# Patient Record
Sex: Female | Born: 1935 | Race: White | Hispanic: No | Smoking: Never smoker
Health system: Southern US, Community
[De-identification: ages and names within clinical notes are randomized; demographics above are authoritative.]

---

## 2011-01-25 DIAGNOSIS — Z950 Presence of cardiac pacemaker: Secondary | ICD-10-CM | POA: Diagnosis not present

## 2011-02-20 DIAGNOSIS — I4891 Unspecified atrial fibrillation: Secondary | ICD-10-CM | POA: Diagnosis not present

## 2011-02-20 DIAGNOSIS — R0602 Shortness of breath: Secondary | ICD-10-CM | POA: Diagnosis not present

## 2011-02-20 DIAGNOSIS — Z79899 Other long term (current) drug therapy: Secondary | ICD-10-CM | POA: Diagnosis not present

## 2011-02-20 DIAGNOSIS — Z7901 Long term (current) use of anticoagulants: Secondary | ICD-10-CM | POA: Diagnosis not present

## 2011-02-20 DIAGNOSIS — I10 Essential (primary) hypertension: Secondary | ICD-10-CM | POA: Diagnosis not present

## 2011-02-20 DIAGNOSIS — E785 Hyperlipidemia, unspecified: Secondary | ICD-10-CM | POA: Diagnosis not present

## 2011-02-20 DIAGNOSIS — Z950 Presence of cardiac pacemaker: Secondary | ICD-10-CM | POA: Diagnosis not present

## 2011-03-06 DIAGNOSIS — I4891 Unspecified atrial fibrillation: Secondary | ICD-10-CM | POA: Diagnosis not present

## 2011-03-06 DIAGNOSIS — M79609 Pain in unspecified limb: Secondary | ICD-10-CM | POA: Diagnosis not present

## 2011-03-06 DIAGNOSIS — I119 Hypertensive heart disease without heart failure: Secondary | ICD-10-CM | POA: Diagnosis not present

## 2011-05-07 DIAGNOSIS — I4891 Unspecified atrial fibrillation: Secondary | ICD-10-CM | POA: Diagnosis not present

## 2011-05-07 DIAGNOSIS — I10 Essential (primary) hypertension: Secondary | ICD-10-CM | POA: Diagnosis not present

## 2011-05-07 DIAGNOSIS — E785 Hyperlipidemia, unspecified: Secondary | ICD-10-CM | POA: Diagnosis not present

## 2011-05-07 DIAGNOSIS — R0602 Shortness of breath: Secondary | ICD-10-CM | POA: Diagnosis not present

## 2011-06-15 DIAGNOSIS — E7800 Pure hypercholesterolemia, unspecified: Secondary | ICD-10-CM | POA: Diagnosis not present

## 2011-06-15 DIAGNOSIS — Z950 Presence of cardiac pacemaker: Secondary | ICD-10-CM | POA: Diagnosis not present

## 2011-06-15 DIAGNOSIS — R404 Transient alteration of awareness: Secondary | ICD-10-CM | POA: Diagnosis not present

## 2011-06-15 DIAGNOSIS — R5383 Other fatigue: Secondary | ICD-10-CM | POA: Diagnosis not present

## 2011-06-15 DIAGNOSIS — I10 Essential (primary) hypertension: Secondary | ICD-10-CM | POA: Diagnosis not present

## 2011-06-15 DIAGNOSIS — J449 Chronic obstructive pulmonary disease, unspecified: Secondary | ICD-10-CM | POA: Diagnosis not present

## 2011-06-15 DIAGNOSIS — R5381 Other malaise: Secondary | ICD-10-CM | POA: Diagnosis not present

## 2011-06-15 DIAGNOSIS — R0602 Shortness of breath: Secondary | ICD-10-CM | POA: Diagnosis not present

## 2011-06-15 DIAGNOSIS — R42 Dizziness and giddiness: Secondary | ICD-10-CM | POA: Diagnosis not present

## 2011-06-15 DIAGNOSIS — I4891 Unspecified atrial fibrillation: Secondary | ICD-10-CM | POA: Diagnosis not present

## 2011-06-18 DIAGNOSIS — R5383 Other fatigue: Secondary | ICD-10-CM | POA: Diagnosis not present

## 2011-06-18 DIAGNOSIS — E7800 Pure hypercholesterolemia, unspecified: Secondary | ICD-10-CM | POA: Diagnosis not present

## 2011-06-18 DIAGNOSIS — R5381 Other malaise: Secondary | ICD-10-CM | POA: Diagnosis not present

## 2011-06-18 DIAGNOSIS — E559 Vitamin D deficiency, unspecified: Secondary | ICD-10-CM | POA: Diagnosis not present

## 2011-06-18 DIAGNOSIS — G479 Sleep disorder, unspecified: Secondary | ICD-10-CM | POA: Diagnosis not present

## 2011-09-06 DIAGNOSIS — Z950 Presence of cardiac pacemaker: Secondary | ICD-10-CM | POA: Diagnosis not present

## 2011-10-03 DIAGNOSIS — Z7901 Long term (current) use of anticoagulants: Secondary | ICD-10-CM | POA: Diagnosis not present

## 2011-10-03 DIAGNOSIS — Z79899 Other long term (current) drug therapy: Secondary | ICD-10-CM | POA: Diagnosis not present

## 2011-10-03 DIAGNOSIS — I4891 Unspecified atrial fibrillation: Secondary | ICD-10-CM | POA: Diagnosis not present

## 2011-10-03 DIAGNOSIS — Z87891 Personal history of nicotine dependence: Secondary | ICD-10-CM | POA: Diagnosis not present

## 2011-10-19 DIAGNOSIS — I10 Essential (primary) hypertension: Secondary | ICD-10-CM | POA: Diagnosis not present

## 2011-10-19 DIAGNOSIS — I4891 Unspecified atrial fibrillation: Secondary | ICD-10-CM | POA: Diagnosis not present

## 2011-10-25 DIAGNOSIS — Z23 Encounter for immunization: Secondary | ICD-10-CM | POA: Diagnosis not present

## 2011-12-03 DIAGNOSIS — R1310 Dysphagia, unspecified: Secondary | ICD-10-CM | POA: Diagnosis not present

## 2011-12-03 DIAGNOSIS — M4800 Spinal stenosis, site unspecified: Secondary | ICD-10-CM | POA: Diagnosis not present

## 2011-12-19 DIAGNOSIS — I499 Cardiac arrhythmia, unspecified: Secondary | ICD-10-CM | POA: Diagnosis not present

## 2012-01-21 DIAGNOSIS — R1310 Dysphagia, unspecified: Secondary | ICD-10-CM | POA: Diagnosis not present

## 2012-01-21 DIAGNOSIS — M4800 Spinal stenosis, site unspecified: Secondary | ICD-10-CM | POA: Diagnosis not present

## 2012-01-21 DIAGNOSIS — M1610 Unilateral primary osteoarthritis, unspecified hip: Secondary | ICD-10-CM | POA: Diagnosis not present

## 2012-01-21 DIAGNOSIS — Z79899 Other long term (current) drug therapy: Secondary | ICD-10-CM | POA: Diagnosis not present

## 2012-04-03 DIAGNOSIS — Z950 Presence of cardiac pacemaker: Secondary | ICD-10-CM | POA: Diagnosis not present

## 2012-04-03 DIAGNOSIS — E7800 Pure hypercholesterolemia, unspecified: Secondary | ICD-10-CM | POA: Diagnosis not present

## 2012-04-03 DIAGNOSIS — Y838 Other surgical procedures as the cause of abnormal reaction of the patient, or of later complication, without mention of misadventure at the time of the procedure: Secondary | ICD-10-CM | POA: Diagnosis not present

## 2012-04-03 DIAGNOSIS — Z8673 Personal history of transient ischemic attack (TIA), and cerebral infarction without residual deficits: Secondary | ICD-10-CM | POA: Diagnosis not present

## 2012-04-03 DIAGNOSIS — IMO0002 Reserved for concepts with insufficient information to code with codable children: Secondary | ICD-10-CM | POA: Diagnosis not present

## 2012-04-03 DIAGNOSIS — I4891 Unspecified atrial fibrillation: Secondary | ICD-10-CM | POA: Diagnosis not present

## 2012-04-03 DIAGNOSIS — Z7901 Long term (current) use of anticoagulants: Secondary | ICD-10-CM | POA: Diagnosis not present

## 2012-04-03 DIAGNOSIS — J449 Chronic obstructive pulmonary disease, unspecified: Secondary | ICD-10-CM | POA: Diagnosis not present

## 2012-04-03 DIAGNOSIS — I10 Essential (primary) hypertension: Secondary | ICD-10-CM | POA: Diagnosis not present

## 2012-04-07 DIAGNOSIS — I498 Other specified cardiac arrhythmias: Secondary | ICD-10-CM | POA: Diagnosis not present

## 2012-04-21 DIAGNOSIS — Z79899 Other long term (current) drug therapy: Secondary | ICD-10-CM | POA: Diagnosis not present

## 2012-05-13 DIAGNOSIS — E785 Hyperlipidemia, unspecified: Secondary | ICD-10-CM | POA: Diagnosis not present

## 2012-05-13 DIAGNOSIS — I10 Essential (primary) hypertension: Secondary | ICD-10-CM | POA: Diagnosis not present

## 2012-05-13 DIAGNOSIS — Z950 Presence of cardiac pacemaker: Secondary | ICD-10-CM | POA: Diagnosis not present

## 2012-05-13 DIAGNOSIS — I4891 Unspecified atrial fibrillation: Secondary | ICD-10-CM | POA: Diagnosis not present

## 2012-05-13 DIAGNOSIS — Z87891 Personal history of nicotine dependence: Secondary | ICD-10-CM | POA: Diagnosis not present

## 2012-07-30 DIAGNOSIS — I498 Other specified cardiac arrhythmias: Secondary | ICD-10-CM | POA: Diagnosis not present

## 2012-10-24 DIAGNOSIS — Z006 Encounter for examination for normal comparison and control in clinical research program: Secondary | ICD-10-CM | POA: Diagnosis not present

## 2012-10-24 DIAGNOSIS — E7800 Pure hypercholesterolemia, unspecified: Secondary | ICD-10-CM | POA: Diagnosis not present

## 2012-10-24 DIAGNOSIS — E559 Vitamin D deficiency, unspecified: Secondary | ICD-10-CM | POA: Diagnosis not present

## 2012-10-24 DIAGNOSIS — I10 Essential (primary) hypertension: Secondary | ICD-10-CM | POA: Diagnosis not present

## 2012-10-24 DIAGNOSIS — Z23 Encounter for immunization: Secondary | ICD-10-CM | POA: Diagnosis not present

## 2012-10-24 DIAGNOSIS — Z79899 Other long term (current) drug therapy: Secondary | ICD-10-CM | POA: Diagnosis not present

## 2012-10-24 DIAGNOSIS — K219 Gastro-esophageal reflux disease without esophagitis: Secondary | ICD-10-CM | POA: Diagnosis not present

## 2012-11-11 DIAGNOSIS — M810 Age-related osteoporosis without current pathological fracture: Secondary | ICD-10-CM | POA: Diagnosis not present

## 2012-11-13 DIAGNOSIS — Z950 Presence of cardiac pacemaker: Secondary | ICD-10-CM | POA: Diagnosis not present

## 2012-11-13 DIAGNOSIS — I4891 Unspecified atrial fibrillation: Secondary | ICD-10-CM | POA: Diagnosis not present

## 2012-11-13 DIAGNOSIS — I10 Essential (primary) hypertension: Secondary | ICD-10-CM | POA: Diagnosis not present

## 2012-11-13 DIAGNOSIS — E785 Hyperlipidemia, unspecified: Secondary | ICD-10-CM | POA: Diagnosis not present

## 2012-12-25 DIAGNOSIS — Z950 Presence of cardiac pacemaker: Secondary | ICD-10-CM | POA: Diagnosis not present

## 2012-12-25 DIAGNOSIS — I4891 Unspecified atrial fibrillation: Secondary | ICD-10-CM | POA: Diagnosis not present

## 2013-01-11 DIAGNOSIS — J189 Pneumonia, unspecified organism: Secondary | ICD-10-CM | POA: Diagnosis not present

## 2013-01-11 DIAGNOSIS — K219 Gastro-esophageal reflux disease without esophagitis: Secondary | ICD-10-CM | POA: Diagnosis present

## 2013-01-11 DIAGNOSIS — Z7901 Long term (current) use of anticoagulants: Secondary | ICD-10-CM | POA: Diagnosis not present

## 2013-01-11 DIAGNOSIS — I4891 Unspecified atrial fibrillation: Secondary | ICD-10-CM | POA: Diagnosis not present

## 2013-01-11 DIAGNOSIS — M1990 Unspecified osteoarthritis, unspecified site: Secondary | ICD-10-CM | POA: Diagnosis present

## 2013-01-11 DIAGNOSIS — J9600 Acute respiratory failure, unspecified whether with hypoxia or hypercapnia: Secondary | ICD-10-CM | POA: Diagnosis not present

## 2013-01-11 DIAGNOSIS — R0989 Other specified symptoms and signs involving the circulatory and respiratory systems: Secondary | ICD-10-CM | POA: Diagnosis not present

## 2013-01-11 DIAGNOSIS — E785 Hyperlipidemia, unspecified: Secondary | ICD-10-CM | POA: Diagnosis not present

## 2013-01-11 DIAGNOSIS — R0602 Shortness of breath: Secondary | ICD-10-CM | POA: Diagnosis not present

## 2013-01-11 DIAGNOSIS — F329 Major depressive disorder, single episode, unspecified: Secondary | ICD-10-CM | POA: Diagnosis present

## 2013-01-11 DIAGNOSIS — R5381 Other malaise: Secondary | ICD-10-CM | POA: Diagnosis not present

## 2013-01-11 DIAGNOSIS — J441 Chronic obstructive pulmonary disease with (acute) exacerbation: Secondary | ICD-10-CM | POA: Diagnosis present

## 2013-01-11 DIAGNOSIS — Z950 Presence of cardiac pacemaker: Secondary | ICD-10-CM | POA: Diagnosis not present

## 2013-01-11 DIAGNOSIS — I129 Hypertensive chronic kidney disease with stage 1 through stage 4 chronic kidney disease, or unspecified chronic kidney disease: Secondary | ICD-10-CM | POA: Diagnosis not present

## 2013-01-11 DIAGNOSIS — F411 Generalized anxiety disorder: Secondary | ICD-10-CM | POA: Diagnosis present

## 2013-01-11 DIAGNOSIS — Z8673 Personal history of transient ischemic attack (TIA), and cerebral infarction without residual deficits: Secondary | ICD-10-CM | POA: Diagnosis not present

## 2013-01-11 DIAGNOSIS — Z79899 Other long term (current) drug therapy: Secondary | ICD-10-CM | POA: Diagnosis not present

## 2013-01-19 DIAGNOSIS — J159 Unspecified bacterial pneumonia: Secondary | ICD-10-CM | POA: Diagnosis not present

## 2013-01-19 DIAGNOSIS — I4891 Unspecified atrial fibrillation: Secondary | ICD-10-CM | POA: Diagnosis not present

## 2013-01-19 DIAGNOSIS — R0902 Hypoxemia: Secondary | ICD-10-CM | POA: Diagnosis not present

## 2013-01-19 DIAGNOSIS — I10 Essential (primary) hypertension: Secondary | ICD-10-CM | POA: Diagnosis not present

## 2013-03-18 DIAGNOSIS — L0291 Cutaneous abscess, unspecified: Secondary | ICD-10-CM | POA: Diagnosis not present

## 2013-03-18 DIAGNOSIS — L0390 Cellulitis, unspecified: Secondary | ICD-10-CM | POA: Diagnosis not present

## 2013-03-20 DIAGNOSIS — D72829 Elevated white blood cell count, unspecified: Secondary | ICD-10-CM | POA: Diagnosis not present

## 2013-03-20 DIAGNOSIS — M79609 Pain in unspecified limb: Secondary | ICD-10-CM | POA: Diagnosis not present

## 2013-03-20 DIAGNOSIS — L0390 Cellulitis, unspecified: Secondary | ICD-10-CM | POA: Diagnosis not present

## 2013-03-20 DIAGNOSIS — L0291 Cutaneous abscess, unspecified: Secondary | ICD-10-CM | POA: Diagnosis not present

## 2013-03-20 DIAGNOSIS — D649 Anemia, unspecified: Secondary | ICD-10-CM | POA: Diagnosis not present

## 2013-03-20 DIAGNOSIS — D539 Nutritional anemia, unspecified: Secondary | ICD-10-CM | POA: Diagnosis not present

## 2013-03-23 DIAGNOSIS — D649 Anemia, unspecified: Secondary | ICD-10-CM | POA: Diagnosis not present

## 2013-03-23 DIAGNOSIS — M25529 Pain in unspecified elbow: Secondary | ICD-10-CM | POA: Diagnosis not present

## 2013-03-23 DIAGNOSIS — L0390 Cellulitis, unspecified: Secondary | ICD-10-CM | POA: Diagnosis not present

## 2013-03-23 DIAGNOSIS — M79609 Pain in unspecified limb: Secondary | ICD-10-CM | POA: Diagnosis not present

## 2013-03-23 DIAGNOSIS — L0291 Cutaneous abscess, unspecified: Secondary | ICD-10-CM | POA: Diagnosis not present

## 2013-03-27 DIAGNOSIS — D649 Anemia, unspecified: Secondary | ICD-10-CM | POA: Diagnosis not present

## 2013-03-30 DIAGNOSIS — I498 Other specified cardiac arrhythmias: Secondary | ICD-10-CM | POA: Diagnosis not present

## 2013-04-06 DIAGNOSIS — E538 Deficiency of other specified B group vitamins: Secondary | ICD-10-CM | POA: Diagnosis not present

## 2013-04-14 DIAGNOSIS — R195 Other fecal abnormalities: Secondary | ICD-10-CM | POA: Diagnosis not present

## 2013-04-14 DIAGNOSIS — D649 Anemia, unspecified: Secondary | ICD-10-CM | POA: Diagnosis not present

## 2013-04-15 DIAGNOSIS — N183 Chronic kidney disease, stage 3 unspecified: Secondary | ICD-10-CM | POA: Diagnosis not present

## 2013-04-16 ENCOUNTER — Other Ambulatory Visit: Payer: Self-pay | Admitting: Unknown Physician Specialty

## 2013-04-16 DIAGNOSIS — D649 Anemia, unspecified: Secondary | ICD-10-CM

## 2013-04-16 DIAGNOSIS — R195 Other fecal abnormalities: Secondary | ICD-10-CM

## 2013-05-05 ENCOUNTER — Ambulatory Visit
Admission: RE | Admit: 2013-05-05 | Discharge: 2013-05-05 | Disposition: A | Discharge status: Home / Self Care | Payer: Medicare Other | Source: Ambulatory Visit | Attending: Unknown Physician Specialty | Admitting: Unknown Physician Specialty

## 2013-05-05 DIAGNOSIS — K5730 Diverticulosis of large intestine without perforation or abscess without bleeding: Secondary | ICD-10-CM | POA: Diagnosis not present

## 2013-05-05 DIAGNOSIS — D649 Anemia, unspecified: Secondary | ICD-10-CM

## 2013-05-05 DIAGNOSIS — R195 Other fecal abnormalities: Secondary | ICD-10-CM

## 2013-05-07 DIAGNOSIS — R195 Other fecal abnormalities: Secondary | ICD-10-CM | POA: Diagnosis not present

## 2013-05-12 DIAGNOSIS — R195 Other fecal abnormalities: Secondary | ICD-10-CM | POA: Diagnosis not present

## 2013-05-18 DIAGNOSIS — D649 Anemia, unspecified: Secondary | ICD-10-CM | POA: Diagnosis not present

## 2013-05-22 DIAGNOSIS — I4891 Unspecified atrial fibrillation: Secondary | ICD-10-CM | POA: Diagnosis not present

## 2013-05-22 DIAGNOSIS — Z79899 Other long term (current) drug therapy: Secondary | ICD-10-CM | POA: Diagnosis not present

## 2013-05-22 DIAGNOSIS — E785 Hyperlipidemia, unspecified: Secondary | ICD-10-CM | POA: Diagnosis not present

## 2013-05-22 DIAGNOSIS — I10 Essential (primary) hypertension: Secondary | ICD-10-CM | POA: Diagnosis not present

## 2013-05-22 DIAGNOSIS — Z7901 Long term (current) use of anticoagulants: Secondary | ICD-10-CM | POA: Diagnosis not present

## 2013-05-22 DIAGNOSIS — Z950 Presence of cardiac pacemaker: Secondary | ICD-10-CM | POA: Diagnosis not present

## 2013-05-26 DIAGNOSIS — E538 Deficiency of other specified B group vitamins: Secondary | ICD-10-CM | POA: Diagnosis not present

## 2013-06-02 DIAGNOSIS — D500 Iron deficiency anemia secondary to blood loss (chronic): Secondary | ICD-10-CM | POA: Diagnosis not present

## 2013-06-02 DIAGNOSIS — E538 Deficiency of other specified B group vitamins: Secondary | ICD-10-CM | POA: Diagnosis not present

## 2013-06-09 DIAGNOSIS — E538 Deficiency of other specified B group vitamins: Secondary | ICD-10-CM | POA: Diagnosis not present

## 2013-06-20 DIAGNOSIS — B029 Zoster without complications: Secondary | ICD-10-CM | POA: Diagnosis not present

## 2013-06-20 DIAGNOSIS — B354 Tinea corporis: Secondary | ICD-10-CM | POA: Diagnosis not present

## 2013-06-25 DIAGNOSIS — Z950 Presence of cardiac pacemaker: Secondary | ICD-10-CM | POA: Diagnosis not present

## 2013-08-11 DIAGNOSIS — K921 Melena: Secondary | ICD-10-CM | POA: Diagnosis not present

## 2013-08-14 DIAGNOSIS — R195 Other fecal abnormalities: Secondary | ICD-10-CM | POA: Diagnosis not present

## 2013-08-14 DIAGNOSIS — E538 Deficiency of other specified B group vitamins: Secondary | ICD-10-CM | POA: Diagnosis not present

## 2013-09-28 DIAGNOSIS — I498 Other specified cardiac arrhythmias: Secondary | ICD-10-CM | POA: Diagnosis not present

## 2013-10-01 DIAGNOSIS — D62 Acute posthemorrhagic anemia: Secondary | ICD-10-CM | POA: Diagnosis not present

## 2013-10-01 DIAGNOSIS — K5730 Diverticulosis of large intestine without perforation or abscess without bleeding: Secondary | ICD-10-CM | POA: Diagnosis not present

## 2013-10-01 DIAGNOSIS — K921 Melena: Secondary | ICD-10-CM | POA: Diagnosis not present

## 2013-10-02 DIAGNOSIS — J438 Other emphysema: Secondary | ICD-10-CM | POA: Diagnosis not present

## 2013-10-02 DIAGNOSIS — Z950 Presence of cardiac pacemaker: Secondary | ICD-10-CM | POA: Diagnosis not present

## 2013-10-02 DIAGNOSIS — N390 Urinary tract infection, site not specified: Secondary | ICD-10-CM | POA: Diagnosis not present

## 2013-10-02 DIAGNOSIS — N183 Chronic kidney disease, stage 3 unspecified: Secondary | ICD-10-CM | POA: Diagnosis present

## 2013-10-02 DIAGNOSIS — R5383 Other fatigue: Secondary | ICD-10-CM | POA: Diagnosis not present

## 2013-10-02 DIAGNOSIS — M1990 Unspecified osteoarthritis, unspecified site: Secondary | ICD-10-CM | POA: Diagnosis present

## 2013-10-02 DIAGNOSIS — E785 Hyperlipidemia, unspecified: Secondary | ICD-10-CM | POA: Diagnosis present

## 2013-10-02 DIAGNOSIS — K625 Hemorrhage of anus and rectum: Secondary | ICD-10-CM | POA: Diagnosis not present

## 2013-10-02 DIAGNOSIS — I129 Hypertensive chronic kidney disease with stage 1 through stage 4 chronic kidney disease, or unspecified chronic kidney disease: Secondary | ICD-10-CM | POA: Diagnosis present

## 2013-10-02 DIAGNOSIS — R5381 Other malaise: Secondary | ICD-10-CM | POA: Diagnosis not present

## 2013-10-02 DIAGNOSIS — K921 Melena: Secondary | ICD-10-CM | POA: Diagnosis not present

## 2013-10-02 DIAGNOSIS — K5731 Diverticulosis of large intestine without perforation or abscess with bleeding: Secondary | ICD-10-CM | POA: Diagnosis present

## 2013-10-02 DIAGNOSIS — K922 Gastrointestinal hemorrhage, unspecified: Secondary | ICD-10-CM | POA: Diagnosis not present

## 2013-10-02 DIAGNOSIS — Z23 Encounter for immunization: Secondary | ICD-10-CM | POA: Diagnosis not present

## 2013-10-02 DIAGNOSIS — D62 Acute posthemorrhagic anemia: Secondary | ICD-10-CM | POA: Diagnosis not present

## 2013-10-02 DIAGNOSIS — Z87891 Personal history of nicotine dependence: Secondary | ICD-10-CM | POA: Diagnosis not present

## 2013-10-02 DIAGNOSIS — K219 Gastro-esophageal reflux disease without esophagitis: Secondary | ICD-10-CM | POA: Diagnosis present

## 2013-10-02 DIAGNOSIS — I4891 Unspecified atrial fibrillation: Secondary | ICD-10-CM | POA: Diagnosis not present

## 2013-10-02 DIAGNOSIS — F329 Major depressive disorder, single episode, unspecified: Secondary | ICD-10-CM | POA: Diagnosis present

## 2013-10-02 DIAGNOSIS — K5730 Diverticulosis of large intestine without perforation or abscess without bleeding: Secondary | ICD-10-CM | POA: Diagnosis not present

## 2013-10-02 DIAGNOSIS — B961 Klebsiella pneumoniae [K. pneumoniae] as the cause of diseases classified elsewhere: Secondary | ICD-10-CM | POA: Diagnosis present

## 2013-10-02 DIAGNOSIS — K449 Diaphragmatic hernia without obstruction or gangrene: Secondary | ICD-10-CM | POA: Diagnosis not present

## 2013-10-02 DIAGNOSIS — Z7901 Long term (current) use of anticoagulants: Secondary | ICD-10-CM | POA: Diagnosis not present

## 2013-10-02 DIAGNOSIS — F411 Generalized anxiety disorder: Secondary | ICD-10-CM | POA: Diagnosis present

## 2013-10-02 DIAGNOSIS — N3000 Acute cystitis without hematuria: Secondary | ICD-10-CM | POA: Diagnosis present

## 2013-10-02 DIAGNOSIS — F3289 Other specified depressive episodes: Secondary | ICD-10-CM | POA: Diagnosis present

## 2013-10-02 DIAGNOSIS — Z8673 Personal history of transient ischemic attack (TIA), and cerebral infarction without residual deficits: Secondary | ICD-10-CM | POA: Diagnosis not present

## 2013-10-02 DIAGNOSIS — J449 Chronic obstructive pulmonary disease, unspecified: Secondary | ICD-10-CM | POA: Diagnosis present

## 2013-10-12 DIAGNOSIS — N390 Urinary tract infection, site not specified: Secondary | ICD-10-CM | POA: Diagnosis not present

## 2013-10-12 DIAGNOSIS — K922 Gastrointestinal hemorrhage, unspecified: Secondary | ICD-10-CM | POA: Diagnosis not present

## 2013-10-12 DIAGNOSIS — D510 Vitamin B12 deficiency anemia due to intrinsic factor deficiency: Secondary | ICD-10-CM | POA: Diagnosis not present

## 2013-10-12 DIAGNOSIS — I4891 Unspecified atrial fibrillation: Secondary | ICD-10-CM | POA: Diagnosis not present

## 2013-10-12 DIAGNOSIS — M4800 Spinal stenosis, site unspecified: Secondary | ICD-10-CM | POA: Diagnosis not present

## 2013-10-12 DIAGNOSIS — I10 Essential (primary) hypertension: Secondary | ICD-10-CM | POA: Diagnosis not present

## 2013-10-12 DIAGNOSIS — Z23 Encounter for immunization: Secondary | ICD-10-CM | POA: Diagnosis not present

## 2013-10-16 DIAGNOSIS — Z7901 Long term (current) use of anticoagulants: Secondary | ICD-10-CM | POA: Diagnosis not present

## 2013-10-16 DIAGNOSIS — E785 Hyperlipidemia, unspecified: Secondary | ICD-10-CM | POA: Diagnosis not present

## 2013-10-16 DIAGNOSIS — K5791 Diverticulosis of intestine, part unspecified, without perforation or abscess with bleeding: Secondary | ICD-10-CM | POA: Diagnosis not present

## 2013-10-16 DIAGNOSIS — D500 Iron deficiency anemia secondary to blood loss (chronic): Secondary | ICD-10-CM | POA: Diagnosis not present

## 2013-10-16 DIAGNOSIS — I4891 Unspecified atrial fibrillation: Secondary | ICD-10-CM | POA: Diagnosis not present

## 2013-10-16 DIAGNOSIS — Z950 Presence of cardiac pacemaker: Secondary | ICD-10-CM | POA: Diagnosis not present

## 2013-10-21 DIAGNOSIS — K5791 Diverticulosis of intestine, part unspecified, without perforation or abscess with bleeding: Secondary | ICD-10-CM | POA: Diagnosis not present

## 2013-11-04 DIAGNOSIS — D500 Iron deficiency anemia secondary to blood loss (chronic): Secondary | ICD-10-CM | POA: Diagnosis not present

## 2013-11-04 DIAGNOSIS — Z7901 Long term (current) use of anticoagulants: Secondary | ICD-10-CM | POA: Diagnosis not present

## 2013-11-04 DIAGNOSIS — I4891 Unspecified atrial fibrillation: Secondary | ICD-10-CM | POA: Diagnosis not present

## 2013-11-18 DIAGNOSIS — K5791 Diverticulosis of intestine, part unspecified, without perforation or abscess with bleeding: Secondary | ICD-10-CM | POA: Diagnosis not present

## 2013-11-18 DIAGNOSIS — D510 Vitamin B12 deficiency anemia due to intrinsic factor deficiency: Secondary | ICD-10-CM | POA: Diagnosis not present

## 2014-04-08 DIAGNOSIS — Z45010 Encounter for checking and testing of cardiac pacemaker pulse generator [battery]: Secondary | ICD-10-CM | POA: Diagnosis not present

## 2014-04-08 DIAGNOSIS — R001 Bradycardia, unspecified: Secondary | ICD-10-CM | POA: Diagnosis not present

## 2014-07-27 DIAGNOSIS — I4891 Unspecified atrial fibrillation: Secondary | ICD-10-CM | POA: Diagnosis not present

## 2014-07-27 DIAGNOSIS — N183 Chronic kidney disease, stage 3 (moderate): Secondary | ICD-10-CM | POA: Diagnosis not present

## 2014-07-27 DIAGNOSIS — E780 Pure hypercholesterolemia: Secondary | ICD-10-CM | POA: Diagnosis not present

## 2014-07-27 DIAGNOSIS — I10 Essential (primary) hypertension: Secondary | ICD-10-CM | POA: Diagnosis not present

## 2014-07-27 DIAGNOSIS — N3000 Acute cystitis without hematuria: Secondary | ICD-10-CM | POA: Diagnosis not present

## 2014-07-27 DIAGNOSIS — Z79899 Other long term (current) drug therapy: Secondary | ICD-10-CM | POA: Diagnosis not present

## 2014-09-09 DIAGNOSIS — I480 Paroxysmal atrial fibrillation: Secondary | ICD-10-CM | POA: Diagnosis not present

## 2014-09-09 DIAGNOSIS — I482 Chronic atrial fibrillation: Secondary | ICD-10-CM | POA: Diagnosis not present

## 2014-09-09 DIAGNOSIS — Z45018 Encounter for adjustment and management of other part of cardiac pacemaker: Secondary | ICD-10-CM | POA: Diagnosis not present

## 2014-09-09 DIAGNOSIS — Z950 Presence of cardiac pacemaker: Secondary | ICD-10-CM | POA: Diagnosis not present

## 2014-09-22 DIAGNOSIS — E785 Hyperlipidemia, unspecified: Secondary | ICD-10-CM | POA: Insufficient documentation

## 2014-09-22 DIAGNOSIS — I480 Paroxysmal atrial fibrillation: Secondary | ICD-10-CM | POA: Insufficient documentation

## 2014-09-22 DIAGNOSIS — Z950 Presence of cardiac pacemaker: Secondary | ICD-10-CM | POA: Insufficient documentation

## 2014-10-22 DIAGNOSIS — Z23 Encounter for immunization: Secondary | ICD-10-CM | POA: Diagnosis not present

## 2014-11-22 DIAGNOSIS — H26493 Other secondary cataract, bilateral: Secondary | ICD-10-CM | POA: Diagnosis not present

## 2014-11-29 DIAGNOSIS — H26491 Other secondary cataract, right eye: Secondary | ICD-10-CM | POA: Diagnosis not present

## 2014-12-10 DIAGNOSIS — Z950 Presence of cardiac pacemaker: Secondary | ICD-10-CM | POA: Diagnosis not present

## 2014-12-10 DIAGNOSIS — E785 Hyperlipidemia, unspecified: Secondary | ICD-10-CM | POA: Diagnosis not present

## 2014-12-10 DIAGNOSIS — K921 Melena: Secondary | ICD-10-CM | POA: Diagnosis not present

## 2014-12-10 DIAGNOSIS — I480 Paroxysmal atrial fibrillation: Secondary | ICD-10-CM | POA: Diagnosis not present

## 2014-12-22 DIAGNOSIS — I498 Other specified cardiac arrhythmias: Secondary | ICD-10-CM | POA: Diagnosis not present

## 2014-12-22 DIAGNOSIS — Z45010 Encounter for checking and testing of cardiac pacemaker pulse generator [battery]: Secondary | ICD-10-CM | POA: Diagnosis not present

## 2014-12-28 DIAGNOSIS — I480 Paroxysmal atrial fibrillation: Secondary | ICD-10-CM | POA: Diagnosis not present

## 2014-12-28 DIAGNOSIS — E785 Hyperlipidemia, unspecified: Secondary | ICD-10-CM | POA: Diagnosis not present

## 2014-12-28 DIAGNOSIS — Z950 Presence of cardiac pacemaker: Secondary | ICD-10-CM | POA: Diagnosis not present

## 2015-01-12 DIAGNOSIS — E785 Hyperlipidemia, unspecified: Secondary | ICD-10-CM | POA: Diagnosis not present

## 2015-01-12 DIAGNOSIS — I480 Paroxysmal atrial fibrillation: Secondary | ICD-10-CM | POA: Diagnosis not present

## 2015-01-12 DIAGNOSIS — Z950 Presence of cardiac pacemaker: Secondary | ICD-10-CM | POA: Diagnosis not present

## 2015-01-27 DIAGNOSIS — I4891 Unspecified atrial fibrillation: Secondary | ICD-10-CM | POA: Diagnosis not present

## 2015-02-07 IMAGING — CT CT VIRTUAL COLONOSCOPY DIAGNOSTIC
3 of 10 series · 11 of 36 positions shown, 16 images · non-contrast
Comparison: Barium enema of 12/01/2007

CLINICAL DATA: Anemia, of cul GI bleeding, history of incomplete
optical colonoscopy in 4442 with barium enema in 4554

EXAM:
CT VIRTUAL COLONOSCOPY DIAGNOSTIC
TECHNIQUE: The patient was given a standard magnesium citrate and suppositories
bowel preparation with Gastrografin and barium for fluid and stool
tagging respectively. The quality of the bowel preparation is
moderate. Automated CO2 insufflation of the colon was performed
prior to image acquisition and colonic distention is moderate to
poor. Image post processing was used to generate a 3D endoluminal
fly-through projection of the colon and to electronically subtract
stool/fluid as appropriate.

[Series 6: prone (id) · axial · 0.70mm/px · z∈[-550,-165]mm · 5 of 462 slices shown, 10 images (1 of 2)]
[im 77/462  soft-tissue]
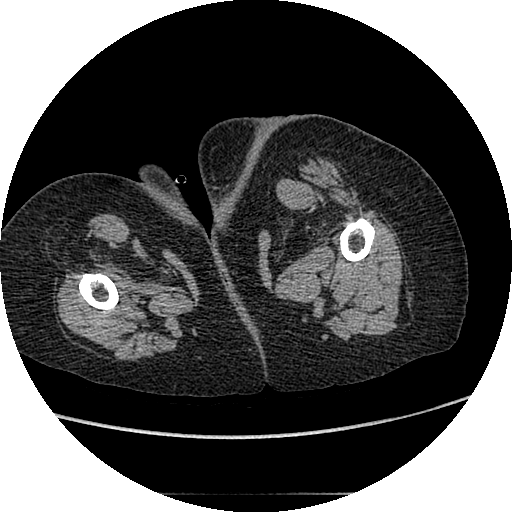
[im 77/462  bone]
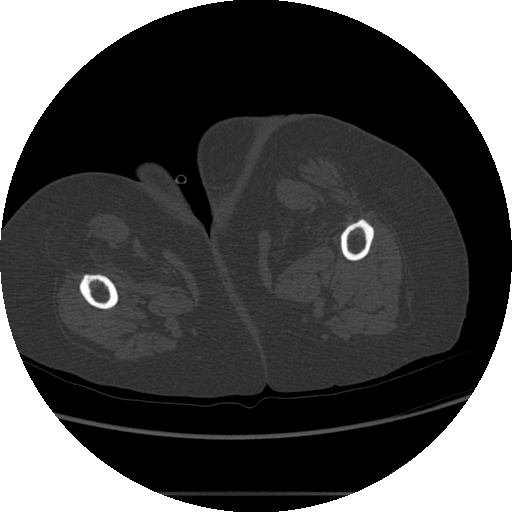
[im 154/462  soft-tissue]
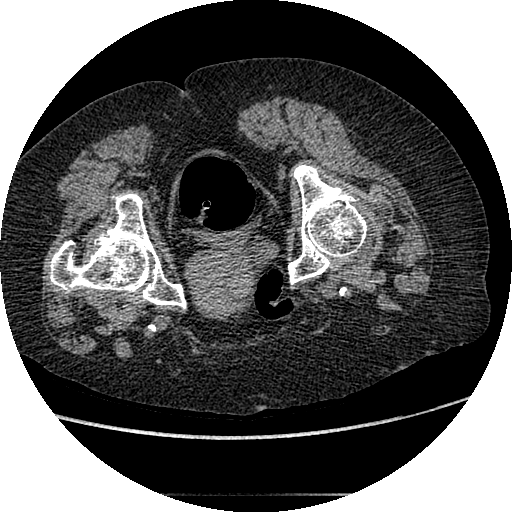
[im 154/462  lung]
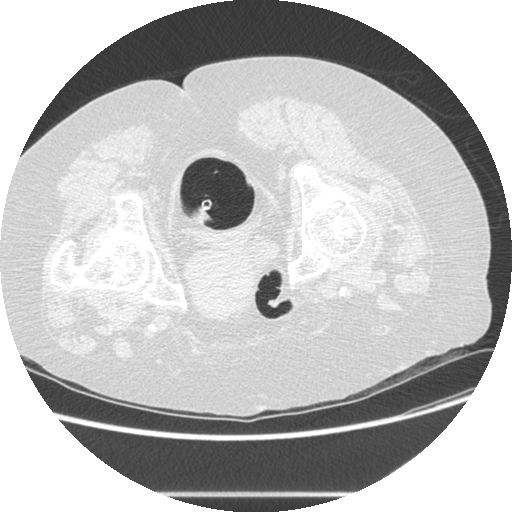
[im 231/462  soft-tissue]
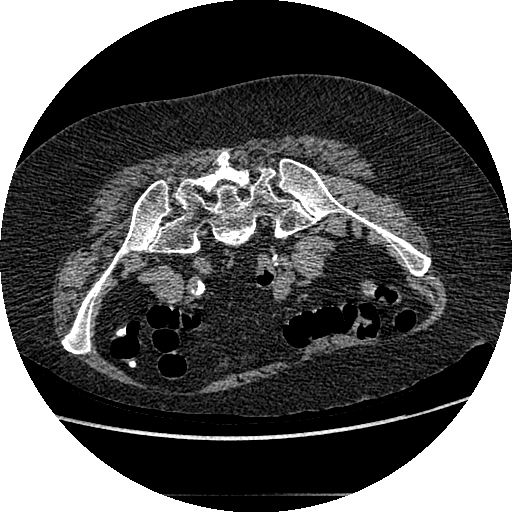
[im 231/462  lung]
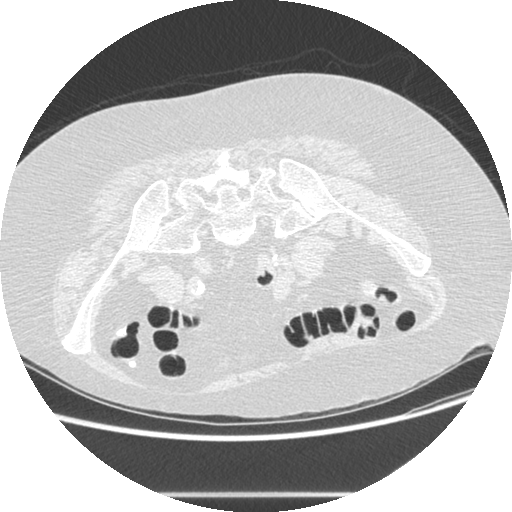
[im 308/462  soft-tissue]
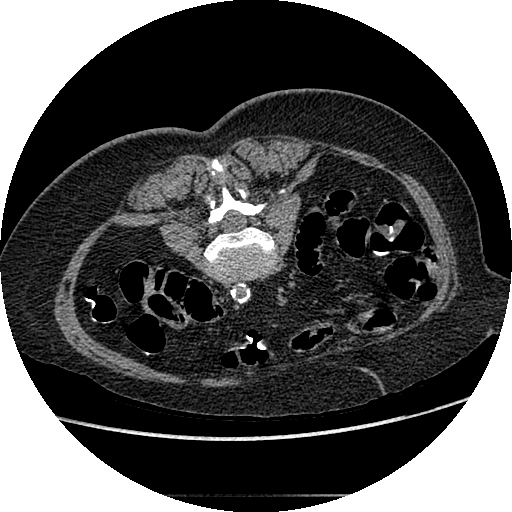
[im 308/462  lung]
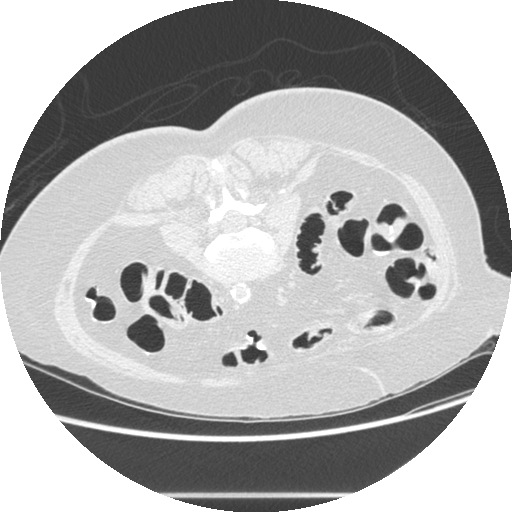
[im 385/462  soft-tissue]
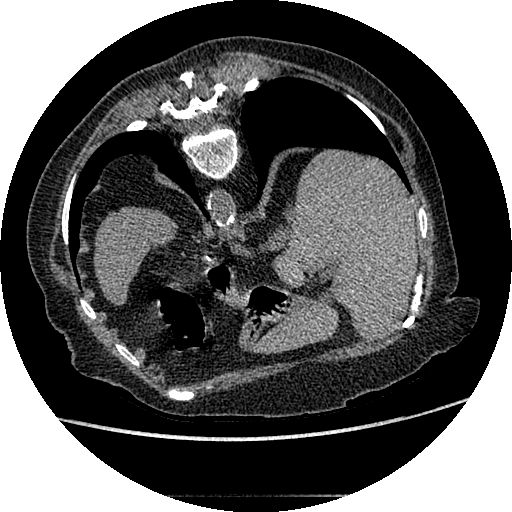
[im 385/462  lung]
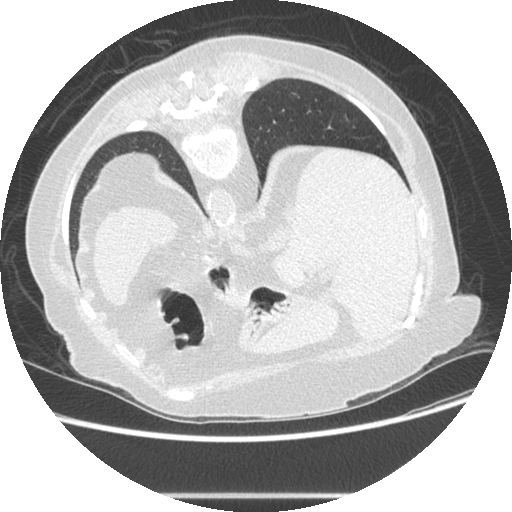

[Series 7: prone (id) · axial · 0.70mm/px · z∈[-454,-261]mm · 2 of 231 slices shown (2 of 2)]
[im 77/231  soft-tissue]
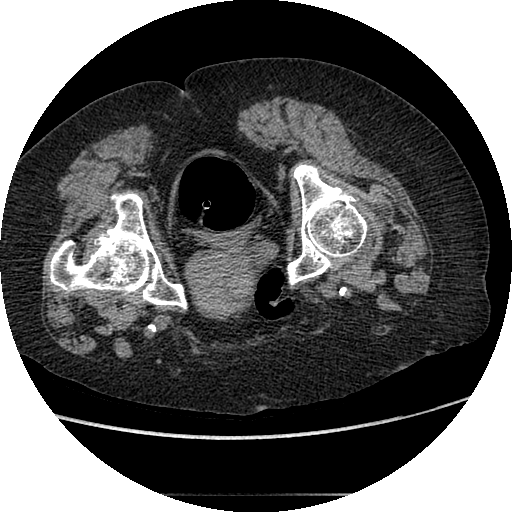
[im 154/231  soft-tissue]
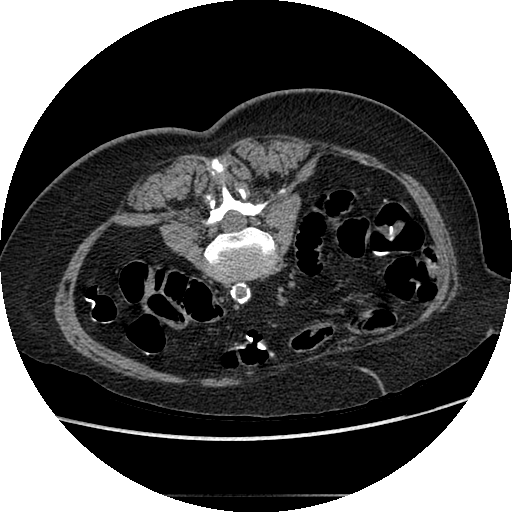

[Series 9: supine (id) · axial · 0.70mm/px · z∈[-388,-114]mm · 4 of 365 slices shown]
[im 73/365  soft-tissue]
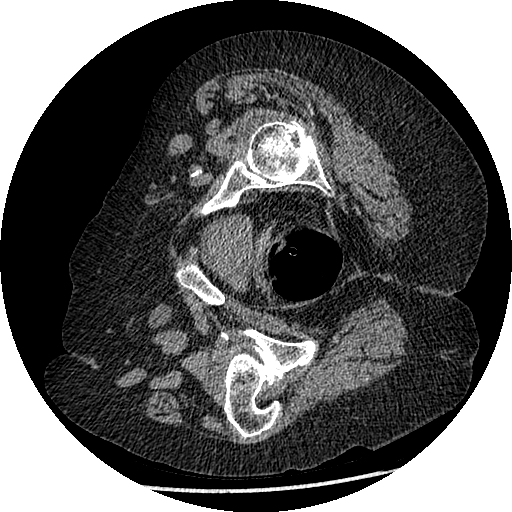
[im 146/365  soft-tissue]
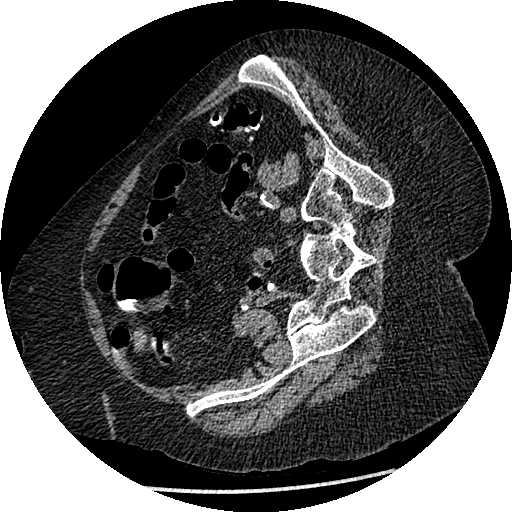
[im 219/365  soft-tissue]
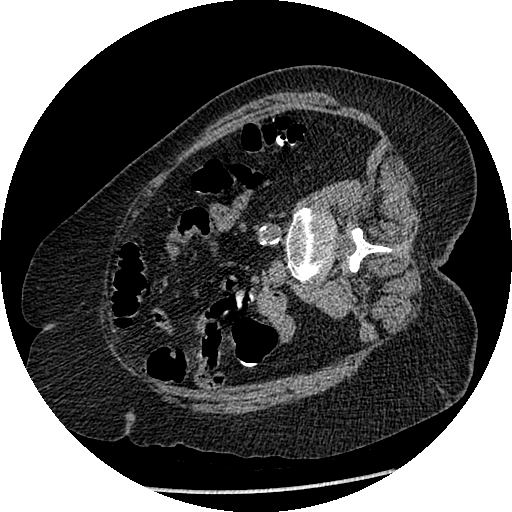
[im 292/365  soft-tissue]
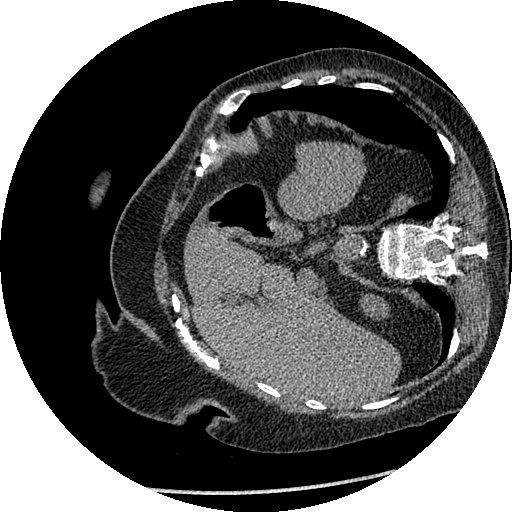

[11 of 36 positions shown; findings below may reference images not displayed]

FINDINGS: Reviewing both 2D and 3D images, there is severe diverticular
disease of the colon present. The rectosigmoid colon is very poorly
distended due to extensive diverticular disease and diverticulosis.
Better seen on 2D images, there is an area of somewhat asymmetric
mucosal thickening of the rectosigmoid colon distally, and an occult
neoplasm cannot be excluded. Diverticula also are scattered
throughout the entire colon. No other clinically significant
polypoid lesion is evident. The right colon and cecum is relatively
well seen on the supine and right lateral decubitus images. The
cecum is noted to be positioned low in the right pelvis. The
ileocecal valve is unremarkable.
IMPRESSION: 1. Extensive diverticular disease with significant involvement of
the rectosigmoid colon which therefore is suboptimally distended and
cannot be evaluated.
2. Somewhat asymmetric thickening of the mucosa of a short segment
of the rectosigmoid colon could be due to the extensive
diverticulosis, but an occult neoplasm cannot be excluded.
3. No definite clinically significant polyp is seen.

Virtual colonoscopy is not designed to detect diminutive polyps
(i.e., less than or equal to 5 mm), the presence or absence of which
may not affect clinical management.

CT ABDOMEN AND PELVIS WITHOUT CONTRAST
FINDINGS: The lung bases are clear. The heart is within normal
limits in size for age. Permanent pacemaker wire is noted. A
moderate size hiatal hernia is present. The liver is unremarkable in
the unenhanced state. The contracted gallbladder is seen with no
definite calcified gallstones noted. The pancreas is normal in size
and the pancreatic duct is not dilated. The adrenal glands and
spleen are unremarkable. The stomach is decompressed. No renal
calculi are seen and there is no evidence of hydronephrosis. The
abdominal aorta is normal in caliber with moderate atheromatous
change present. No adenopathy is seen.

Severe diverticulosis of the rectosigmoid colon is noted.
Diverticula are scattered throughout the entire colon. The urinary
bladder is decompressed. The uterus is unremarkable for age. No
adnexal lesion is seen. There is a somewhat prominent right groin
lymph node present of 15 x 27 mm, and clinical correlation is
recommended. No fluid is noted within the pelvis. Severe
degenerative joint disease of the hips is noted
left-greater-than-right.

1. Prominent right groin lymph node of uncertain significance
measuring 15 x 27 mm.
2. Severe diverticulosis of the rectosigmoid colon.
3. Moderate size hiatal hernia.
4. Severe degenerative joint disease of the hips.

## 2015-02-11 DIAGNOSIS — M159 Polyosteoarthritis, unspecified: Secondary | ICD-10-CM | POA: Diagnosis not present

## 2015-02-14 DIAGNOSIS — I4891 Unspecified atrial fibrillation: Secondary | ICD-10-CM | POA: Diagnosis not present

## 2015-03-04 DIAGNOSIS — I4891 Unspecified atrial fibrillation: Secondary | ICD-10-CM | POA: Diagnosis not present

## 2015-03-17 DIAGNOSIS — D649 Anemia, unspecified: Secondary | ICD-10-CM | POA: Diagnosis not present

## 2015-03-23 DIAGNOSIS — Z45010 Encounter for checking and testing of cardiac pacemaker pulse generator [battery]: Secondary | ICD-10-CM | POA: Diagnosis not present

## 2015-03-23 DIAGNOSIS — I498 Other specified cardiac arrhythmias: Secondary | ICD-10-CM | POA: Diagnosis not present

## 2015-04-13 DIAGNOSIS — E785 Hyperlipidemia, unspecified: Secondary | ICD-10-CM | POA: Diagnosis not present

## 2015-04-13 DIAGNOSIS — I480 Paroxysmal atrial fibrillation: Secondary | ICD-10-CM | POA: Diagnosis not present

## 2015-04-13 DIAGNOSIS — Z950 Presence of cardiac pacemaker: Secondary | ICD-10-CM | POA: Diagnosis not present

## 2015-06-07 DIAGNOSIS — H26492 Other secondary cataract, left eye: Secondary | ICD-10-CM | POA: Diagnosis not present

## 2015-06-24 DIAGNOSIS — Z950 Presence of cardiac pacemaker: Secondary | ICD-10-CM | POA: Diagnosis not present

## 2015-06-24 DIAGNOSIS — Z45010 Encounter for checking and testing of cardiac pacemaker pulse generator [battery]: Secondary | ICD-10-CM | POA: Diagnosis not present

## 2015-06-24 DIAGNOSIS — I498 Other specified cardiac arrhythmias: Secondary | ICD-10-CM | POA: Diagnosis not present

## 2015-07-12 DIAGNOSIS — I480 Paroxysmal atrial fibrillation: Secondary | ICD-10-CM | POA: Diagnosis not present

## 2015-07-12 DIAGNOSIS — E785 Hyperlipidemia, unspecified: Secondary | ICD-10-CM | POA: Diagnosis not present

## 2015-07-12 DIAGNOSIS — Z950 Presence of cardiac pacemaker: Secondary | ICD-10-CM | POA: Diagnosis not present

## 2015-07-21 DIAGNOSIS — I10 Essential (primary) hypertension: Secondary | ICD-10-CM | POA: Diagnosis not present

## 2015-07-21 DIAGNOSIS — N183 Chronic kidney disease, stage 3 (moderate): Secondary | ICD-10-CM | POA: Diagnosis not present

## 2015-07-21 DIAGNOSIS — Z79899 Other long term (current) drug therapy: Secondary | ICD-10-CM | POA: Diagnosis not present

## 2015-07-21 DIAGNOSIS — Z23 Encounter for immunization: Secondary | ICD-10-CM | POA: Diagnosis not present

## 2015-07-21 DIAGNOSIS — E785 Hyperlipidemia, unspecified: Secondary | ICD-10-CM | POA: Diagnosis not present

## 2015-07-21 DIAGNOSIS — N3000 Acute cystitis without hematuria: Secondary | ICD-10-CM | POA: Diagnosis not present

## 2015-07-21 DIAGNOSIS — I4891 Unspecified atrial fibrillation: Secondary | ICD-10-CM | POA: Diagnosis not present

## 2015-07-21 DIAGNOSIS — E559 Vitamin D deficiency, unspecified: Secondary | ICD-10-CM | POA: Diagnosis not present

## 2015-09-26 DIAGNOSIS — Z950 Presence of cardiac pacemaker: Secondary | ICD-10-CM | POA: Diagnosis not present

## 2015-09-27 DIAGNOSIS — I999 Unspecified disorder of circulatory system: Secondary | ICD-10-CM | POA: Diagnosis not present

## 2015-09-27 DIAGNOSIS — M5416 Radiculopathy, lumbar region: Secondary | ICD-10-CM | POA: Diagnosis not present

## 2015-09-27 DIAGNOSIS — M545 Low back pain: Secondary | ICD-10-CM | POA: Diagnosis not present

## 2015-10-12 DIAGNOSIS — M47819 Spondylosis without myelopathy or radiculopathy, site unspecified: Secondary | ICD-10-CM | POA: Diagnosis not present

## 2015-10-12 DIAGNOSIS — M519 Unspecified thoracic, thoracolumbar and lumbosacral intervertebral disc disorder: Secondary | ICD-10-CM | POA: Diagnosis not present

## 2015-10-18 DIAGNOSIS — M545 Low back pain: Secondary | ICD-10-CM | POA: Diagnosis not present

## 2015-10-18 DIAGNOSIS — M5416 Radiculopathy, lumbar region: Secondary | ICD-10-CM | POA: Diagnosis not present

## 2015-10-21 DIAGNOSIS — M5416 Radiculopathy, lumbar region: Secondary | ICD-10-CM | POA: Diagnosis not present

## 2015-10-25 DIAGNOSIS — Z23 Encounter for immunization: Secondary | ICD-10-CM | POA: Diagnosis not present

## 2015-10-25 DIAGNOSIS — E559 Vitamin D deficiency, unspecified: Secondary | ICD-10-CM | POA: Diagnosis not present

## 2015-11-15 DIAGNOSIS — M545 Low back pain: Secondary | ICD-10-CM | POA: Diagnosis not present

## 2015-11-15 DIAGNOSIS — M5416 Radiculopathy, lumbar region: Secondary | ICD-10-CM | POA: Diagnosis not present

## 2015-11-18 DIAGNOSIS — M5416 Radiculopathy, lumbar region: Secondary | ICD-10-CM | POA: Diagnosis not present

## 2015-12-20 DIAGNOSIS — M25562 Pain in left knee: Secondary | ICD-10-CM | POA: Diagnosis not present

## 2015-12-20 DIAGNOSIS — M545 Low back pain: Secondary | ICD-10-CM | POA: Diagnosis not present

## 2015-12-20 DIAGNOSIS — M5416 Radiculopathy, lumbar region: Secondary | ICD-10-CM | POA: Diagnosis not present

## 2015-12-28 DIAGNOSIS — M25562 Pain in left knee: Secondary | ICD-10-CM | POA: Diagnosis not present

## 2015-12-28 DIAGNOSIS — M1712 Unilateral primary osteoarthritis, left knee: Secondary | ICD-10-CM | POA: Diagnosis not present

## 2016-01-03 DIAGNOSIS — M25562 Pain in left knee: Secondary | ICD-10-CM | POA: Diagnosis not present

## 2016-01-26 DIAGNOSIS — E785 Hyperlipidemia, unspecified: Secondary | ICD-10-CM | POA: Diagnosis not present

## 2016-01-26 DIAGNOSIS — Z950 Presence of cardiac pacemaker: Secondary | ICD-10-CM | POA: Diagnosis not present

## 2016-01-26 DIAGNOSIS — Z45018 Encounter for adjustment and management of other part of cardiac pacemaker: Secondary | ICD-10-CM | POA: Diagnosis not present

## 2016-01-26 DIAGNOSIS — I495 Sick sinus syndrome: Secondary | ICD-10-CM | POA: Diagnosis not present

## 2016-01-26 DIAGNOSIS — I480 Paroxysmal atrial fibrillation: Secondary | ICD-10-CM | POA: Diagnosis not present

## 2016-02-22 DIAGNOSIS — M25561 Pain in right knee: Secondary | ICD-10-CM | POA: Diagnosis not present

## 2016-02-22 DIAGNOSIS — M25562 Pain in left knee: Secondary | ICD-10-CM | POA: Diagnosis not present

## 2016-02-22 DIAGNOSIS — M1990 Unspecified osteoarthritis, unspecified site: Secondary | ICD-10-CM | POA: Diagnosis not present

## 2016-02-22 DIAGNOSIS — M25552 Pain in left hip: Secondary | ICD-10-CM | POA: Diagnosis not present

## 2016-02-22 DIAGNOSIS — M170 Bilateral primary osteoarthritis of knee: Secondary | ICD-10-CM | POA: Diagnosis not present

## 2016-03-01 DIAGNOSIS — M170 Bilateral primary osteoarthritis of knee: Secondary | ICD-10-CM | POA: Diagnosis not present

## 2016-03-28 DIAGNOSIS — M25561 Pain in right knee: Secondary | ICD-10-CM | POA: Diagnosis not present

## 2016-03-28 DIAGNOSIS — M170 Bilateral primary osteoarthritis of knee: Secondary | ICD-10-CM | POA: Diagnosis not present

## 2016-03-28 DIAGNOSIS — M25562 Pain in left knee: Secondary | ICD-10-CM | POA: Diagnosis not present

## 2016-04-03 DIAGNOSIS — M170 Bilateral primary osteoarthritis of knee: Secondary | ICD-10-CM | POA: Diagnosis not present

## 2016-04-10 DIAGNOSIS — M170 Bilateral primary osteoarthritis of knee: Secondary | ICD-10-CM | POA: Diagnosis not present

## 2016-04-26 DIAGNOSIS — Z6825 Body mass index (BMI) 25.0-25.9, adult: Secondary | ICD-10-CM | POA: Diagnosis not present

## 2016-04-26 DIAGNOSIS — I4891 Unspecified atrial fibrillation: Secondary | ICD-10-CM | POA: Diagnosis not present

## 2016-04-26 DIAGNOSIS — Z950 Presence of cardiac pacemaker: Secondary | ICD-10-CM | POA: Diagnosis not present

## 2016-04-26 DIAGNOSIS — R609 Edema, unspecified: Secondary | ICD-10-CM | POA: Diagnosis not present

## 2016-05-22 DIAGNOSIS — Z6825 Body mass index (BMI) 25.0-25.9, adult: Secondary | ICD-10-CM | POA: Diagnosis not present

## 2016-05-22 DIAGNOSIS — M7062 Trochanteric bursitis, left hip: Secondary | ICD-10-CM | POA: Diagnosis not present

## 2016-05-22 DIAGNOSIS — M159 Polyosteoarthritis, unspecified: Secondary | ICD-10-CM | POA: Diagnosis not present

## 2016-07-24 DIAGNOSIS — R609 Edema, unspecified: Secondary | ICD-10-CM | POA: Diagnosis not present

## 2016-07-24 DIAGNOSIS — I495 Sick sinus syndrome: Secondary | ICD-10-CM | POA: Diagnosis not present

## 2016-07-24 DIAGNOSIS — Z7901 Long term (current) use of anticoagulants: Secondary | ICD-10-CM | POA: Diagnosis not present

## 2016-07-24 DIAGNOSIS — Z950 Presence of cardiac pacemaker: Secondary | ICD-10-CM | POA: Diagnosis not present

## 2016-07-24 DIAGNOSIS — I480 Paroxysmal atrial fibrillation: Secondary | ICD-10-CM | POA: Diagnosis not present

## 2016-07-24 DIAGNOSIS — E785 Hyperlipidemia, unspecified: Secondary | ICD-10-CM | POA: Diagnosis not present

## 2016-07-26 DIAGNOSIS — Z950 Presence of cardiac pacemaker: Secondary | ICD-10-CM | POA: Diagnosis not present

## 2016-07-27 DIAGNOSIS — M4800 Spinal stenosis, site unspecified: Secondary | ICD-10-CM | POA: Diagnosis not present

## 2016-07-27 DIAGNOSIS — M25562 Pain in left knee: Secondary | ICD-10-CM | POA: Diagnosis not present

## 2016-07-27 DIAGNOSIS — M159 Polyosteoarthritis, unspecified: Secondary | ICD-10-CM | POA: Diagnosis not present

## 2016-07-27 DIAGNOSIS — G8929 Other chronic pain: Secondary | ICD-10-CM | POA: Diagnosis not present

## 2016-07-31 DIAGNOSIS — R609 Edema, unspecified: Secondary | ICD-10-CM | POA: Diagnosis not present

## 2016-07-31 DIAGNOSIS — I480 Paroxysmal atrial fibrillation: Secondary | ICD-10-CM | POA: Diagnosis not present

## 2016-07-31 DIAGNOSIS — Z7901 Long term (current) use of anticoagulants: Secondary | ICD-10-CM | POA: Diagnosis not present

## 2016-08-02 DIAGNOSIS — E875 Hyperkalemia: Secondary | ICD-10-CM | POA: Diagnosis not present

## 2016-08-02 DIAGNOSIS — R946 Abnormal results of thyroid function studies: Secondary | ICD-10-CM | POA: Diagnosis not present

## 2016-08-03 DIAGNOSIS — M25562 Pain in left knee: Secondary | ICD-10-CM | POA: Diagnosis not present

## 2016-08-03 DIAGNOSIS — M170 Bilateral primary osteoarthritis of knee: Secondary | ICD-10-CM | POA: Diagnosis not present

## 2016-08-03 DIAGNOSIS — M25561 Pain in right knee: Secondary | ICD-10-CM | POA: Diagnosis not present

## 2016-08-14 DIAGNOSIS — E875 Hyperkalemia: Secondary | ICD-10-CM | POA: Diagnosis not present

## 2016-09-14 DIAGNOSIS — M25561 Pain in right knee: Secondary | ICD-10-CM | POA: Diagnosis not present

## 2016-09-14 DIAGNOSIS — M25562 Pain in left knee: Secondary | ICD-10-CM | POA: Diagnosis not present

## 2016-09-27 DIAGNOSIS — N3091 Cystitis, unspecified with hematuria: Secondary | ICD-10-CM | POA: Diagnosis not present

## 2016-09-27 DIAGNOSIS — Z6826 Body mass index (BMI) 26.0-26.9, adult: Secondary | ICD-10-CM | POA: Diagnosis not present

## 2016-10-23 DIAGNOSIS — M25562 Pain in left knee: Secondary | ICD-10-CM | POA: Diagnosis not present

## 2016-10-23 DIAGNOSIS — M25552 Pain in left hip: Secondary | ICD-10-CM | POA: Diagnosis not present

## 2016-10-23 DIAGNOSIS — M545 Low back pain: Secondary | ICD-10-CM | POA: Diagnosis not present

## 2016-10-23 DIAGNOSIS — G894 Chronic pain syndrome: Secondary | ICD-10-CM | POA: Diagnosis not present

## 2016-10-25 DIAGNOSIS — Z950 Presence of cardiac pacemaker: Secondary | ICD-10-CM | POA: Diagnosis not present

## 2016-10-31 DIAGNOSIS — Z87891 Personal history of nicotine dependence: Secondary | ICD-10-CM | POA: Diagnosis not present

## 2016-10-31 DIAGNOSIS — E785 Hyperlipidemia, unspecified: Secondary | ICD-10-CM | POA: Diagnosis not present

## 2016-10-31 DIAGNOSIS — Z950 Presence of cardiac pacemaker: Secondary | ICD-10-CM | POA: Diagnosis not present

## 2016-10-31 DIAGNOSIS — I480 Paroxysmal atrial fibrillation: Secondary | ICD-10-CM | POA: Diagnosis not present

## 2016-10-31 DIAGNOSIS — I495 Sick sinus syndrome: Secondary | ICD-10-CM | POA: Diagnosis not present

## 2016-10-31 DIAGNOSIS — Z7901 Long term (current) use of anticoagulants: Secondary | ICD-10-CM | POA: Diagnosis not present

## 2016-10-31 DIAGNOSIS — R609 Edema, unspecified: Secondary | ICD-10-CM | POA: Diagnosis not present

## 2016-12-04 DIAGNOSIS — R29898 Other symptoms and signs involving the musculoskeletal system: Secondary | ICD-10-CM | POA: Diagnosis not present

## 2016-12-04 DIAGNOSIS — R609 Edema, unspecified: Secondary | ICD-10-CM | POA: Diagnosis not present

## 2016-12-04 DIAGNOSIS — M159 Polyosteoarthritis, unspecified: Secondary | ICD-10-CM | POA: Diagnosis not present

## 2016-12-04 DIAGNOSIS — R64 Cachexia: Secondary | ICD-10-CM | POA: Diagnosis not present

## 2016-12-04 DIAGNOSIS — Z23 Encounter for immunization: Secondary | ICD-10-CM | POA: Diagnosis not present

## 2017-01-10 DIAGNOSIS — R64 Cachexia: Secondary | ICD-10-CM | POA: Diagnosis not present

## 2017-01-10 DIAGNOSIS — M4800 Spinal stenosis, site unspecified: Secondary | ICD-10-CM | POA: Diagnosis not present

## 2017-01-10 DIAGNOSIS — M159 Polyosteoarthritis, unspecified: Secondary | ICD-10-CM | POA: Diagnosis not present

## 2017-01-10 DIAGNOSIS — R609 Edema, unspecified: Secondary | ICD-10-CM | POA: Diagnosis not present

## 2017-01-16 DIAGNOSIS — M170 Bilateral primary osteoarthritis of knee: Secondary | ICD-10-CM | POA: Diagnosis not present

## 2017-01-24 DIAGNOSIS — M170 Bilateral primary osteoarthritis of knee: Secondary | ICD-10-CM | POA: Diagnosis not present

## 2017-01-30 DIAGNOSIS — M170 Bilateral primary osteoarthritis of knee: Secondary | ICD-10-CM | POA: Diagnosis not present

## 2017-02-06 DIAGNOSIS — R64 Cachexia: Secondary | ICD-10-CM | POA: Diagnosis not present

## 2017-02-06 DIAGNOSIS — R609 Edema, unspecified: Secondary | ICD-10-CM | POA: Diagnosis not present

## 2017-02-06 DIAGNOSIS — M159 Polyosteoarthritis, unspecified: Secondary | ICD-10-CM | POA: Diagnosis not present

## 2017-02-06 DIAGNOSIS — R29898 Other symptoms and signs involving the musculoskeletal system: Secondary | ICD-10-CM | POA: Diagnosis not present

## 2017-02-14 DIAGNOSIS — I495 Sick sinus syndrome: Secondary | ICD-10-CM | POA: Diagnosis not present

## 2017-02-14 DIAGNOSIS — Z950 Presence of cardiac pacemaker: Secondary | ICD-10-CM | POA: Diagnosis not present

## 2017-02-18 DIAGNOSIS — R609 Edema, unspecified: Secondary | ICD-10-CM | POA: Diagnosis not present

## 2017-02-18 DIAGNOSIS — I495 Sick sinus syndrome: Secondary | ICD-10-CM | POA: Diagnosis not present

## 2017-02-18 DIAGNOSIS — Z950 Presence of cardiac pacemaker: Secondary | ICD-10-CM | POA: Diagnosis not present

## 2017-02-18 DIAGNOSIS — Z7901 Long term (current) use of anticoagulants: Secondary | ICD-10-CM | POA: Diagnosis not present

## 2017-02-18 DIAGNOSIS — E785 Hyperlipidemia, unspecified: Secondary | ICD-10-CM | POA: Diagnosis not present

## 2017-02-18 DIAGNOSIS — I480 Paroxysmal atrial fibrillation: Secondary | ICD-10-CM | POA: Diagnosis not present

## 2017-03-13 DIAGNOSIS — M170 Bilateral primary osteoarthritis of knee: Secondary | ICD-10-CM | POA: Diagnosis not present

## 2017-04-03 DIAGNOSIS — R609 Edema, unspecified: Secondary | ICD-10-CM | POA: Diagnosis not present

## 2017-04-03 DIAGNOSIS — N183 Chronic kidney disease, stage 3 (moderate): Secondary | ICD-10-CM | POA: Diagnosis not present

## 2017-04-03 DIAGNOSIS — R29898 Other symptoms and signs involving the musculoskeletal system: Secondary | ICD-10-CM | POA: Diagnosis not present

## 2017-04-03 DIAGNOSIS — R64 Cachexia: Secondary | ICD-10-CM | POA: Diagnosis not present

## 2017-05-11 DIAGNOSIS — K922 Gastrointestinal hemorrhage, unspecified: Secondary | ICD-10-CM | POA: Diagnosis not present

## 2017-05-11 DIAGNOSIS — R1310 Dysphagia, unspecified: Secondary | ICD-10-CM | POA: Diagnosis not present

## 2017-05-11 DIAGNOSIS — K5900 Constipation, unspecified: Secondary | ICD-10-CM | POA: Diagnosis not present

## 2017-05-11 DIAGNOSIS — K921 Melena: Secondary | ICD-10-CM | POA: Diagnosis not present

## 2017-05-11 DIAGNOSIS — K648 Other hemorrhoids: Secondary | ICD-10-CM | POA: Diagnosis not present

## 2017-05-11 DIAGNOSIS — D62 Acute posthemorrhagic anemia: Secondary | ICD-10-CM | POA: Diagnosis not present

## 2017-05-11 DIAGNOSIS — K5791 Diverticulosis of intestine, part unspecified, without perforation or abscess with bleeding: Secondary | ICD-10-CM | POA: Diagnosis not present

## 2017-05-11 DIAGNOSIS — K5732 Diverticulitis of large intestine without perforation or abscess without bleeding: Secondary | ICD-10-CM | POA: Diagnosis not present

## 2017-05-11 DIAGNOSIS — Z7901 Long term (current) use of anticoagulants: Secondary | ICD-10-CM | POA: Diagnosis not present

## 2017-05-11 DIAGNOSIS — K222 Esophageal obstruction: Secondary | ICD-10-CM | POA: Diagnosis not present

## 2017-05-11 DIAGNOSIS — K219 Gastro-esophageal reflux disease without esophagitis: Secondary | ICD-10-CM | POA: Diagnosis not present

## 2017-05-11 DIAGNOSIS — R195 Other fecal abnormalities: Secondary | ICD-10-CM | POA: Diagnosis not present

## 2017-05-11 DIAGNOSIS — K5730 Diverticulosis of large intestine without perforation or abscess without bleeding: Secondary | ICD-10-CM | POA: Diagnosis not present

## 2017-05-11 DIAGNOSIS — R062 Wheezing: Secondary | ICD-10-CM | POA: Diagnosis not present

## 2017-05-11 DIAGNOSIS — I4891 Unspecified atrial fibrillation: Secondary | ICD-10-CM | POA: Diagnosis not present

## 2017-05-11 DIAGNOSIS — E785 Hyperlipidemia, unspecified: Secondary | ICD-10-CM | POA: Diagnosis not present

## 2017-05-11 DIAGNOSIS — I10 Essential (primary) hypertension: Secondary | ICD-10-CM | POA: Diagnosis not present

## 2017-05-11 DIAGNOSIS — D500 Iron deficiency anemia secondary to blood loss (chronic): Secondary | ICD-10-CM | POA: Diagnosis not present

## 2017-05-12 DIAGNOSIS — K219 Gastro-esophageal reflux disease without esophagitis: Secondary | ICD-10-CM | POA: Diagnosis not present

## 2017-05-12 DIAGNOSIS — K921 Melena: Secondary | ICD-10-CM | POA: Diagnosis not present

## 2017-05-12 DIAGNOSIS — D62 Acute posthemorrhagic anemia: Secondary | ICD-10-CM | POA: Diagnosis not present

## 2017-05-12 DIAGNOSIS — R1310 Dysphagia, unspecified: Secondary | ICD-10-CM | POA: Diagnosis not present

## 2017-05-12 DIAGNOSIS — K648 Other hemorrhoids: Secondary | ICD-10-CM | POA: Diagnosis not present

## 2017-05-12 DIAGNOSIS — Z7901 Long term (current) use of anticoagulants: Secondary | ICD-10-CM | POA: Diagnosis not present

## 2017-05-12 DIAGNOSIS — K5730 Diverticulosis of large intestine without perforation or abscess without bleeding: Secondary | ICD-10-CM | POA: Diagnosis not present

## 2017-05-12 DIAGNOSIS — K5900 Constipation, unspecified: Secondary | ICD-10-CM | POA: Diagnosis not present

## 2017-05-12 DIAGNOSIS — K5791 Diverticulosis of intestine, part unspecified, without perforation or abscess with bleeding: Secondary | ICD-10-CM | POA: Diagnosis not present

## 2017-05-12 DIAGNOSIS — I10 Essential (primary) hypertension: Secondary | ICD-10-CM | POA: Diagnosis not present

## 2017-05-12 DIAGNOSIS — E785 Hyperlipidemia, unspecified: Secondary | ICD-10-CM | POA: Diagnosis not present

## 2017-05-12 DIAGNOSIS — D500 Iron deficiency anemia secondary to blood loss (chronic): Secondary | ICD-10-CM | POA: Diagnosis not present

## 2017-05-12 DIAGNOSIS — R195 Other fecal abnormalities: Secondary | ICD-10-CM | POA: Diagnosis not present

## 2017-05-12 DIAGNOSIS — K222 Esophageal obstruction: Secondary | ICD-10-CM | POA: Diagnosis not present

## 2017-05-12 DIAGNOSIS — I4891 Unspecified atrial fibrillation: Secondary | ICD-10-CM | POA: Diagnosis not present

## 2017-05-13 DIAGNOSIS — K5909 Other constipation: Secondary | ICD-10-CM | POA: Diagnosis present

## 2017-05-13 DIAGNOSIS — K5733 Diverticulitis of large intestine without perforation or abscess with bleeding: Secondary | ICD-10-CM | POA: Diagnosis not present

## 2017-05-13 DIAGNOSIS — K3189 Other diseases of stomach and duodenum: Secondary | ICD-10-CM | POA: Diagnosis not present

## 2017-05-13 DIAGNOSIS — I482 Chronic atrial fibrillation: Secondary | ICD-10-CM | POA: Diagnosis present

## 2017-05-13 DIAGNOSIS — Z8673 Personal history of transient ischemic attack (TIA), and cerebral infarction without residual deficits: Secondary | ICD-10-CM | POA: Diagnosis not present

## 2017-05-13 DIAGNOSIS — D500 Iron deficiency anemia secondary to blood loss (chronic): Secondary | ICD-10-CM | POA: Diagnosis not present

## 2017-05-13 DIAGNOSIS — M1990 Unspecified osteoarthritis, unspecified site: Secondary | ICD-10-CM | POA: Diagnosis present

## 2017-05-13 DIAGNOSIS — Z79899 Other long term (current) drug therapy: Secondary | ICD-10-CM | POA: Diagnosis not present

## 2017-05-13 DIAGNOSIS — N183 Chronic kidney disease, stage 3 (moderate): Secondary | ICD-10-CM | POA: Diagnosis present

## 2017-05-13 DIAGNOSIS — R1310 Dysphagia, unspecified: Secondary | ICD-10-CM | POA: Diagnosis present

## 2017-05-13 DIAGNOSIS — Z950 Presence of cardiac pacemaker: Secondary | ICD-10-CM | POA: Diagnosis not present

## 2017-05-13 DIAGNOSIS — K921 Melena: Secondary | ICD-10-CM | POA: Diagnosis not present

## 2017-05-13 DIAGNOSIS — K219 Gastro-esophageal reflux disease without esophagitis: Secondary | ICD-10-CM | POA: Diagnosis present

## 2017-05-13 DIAGNOSIS — I4891 Unspecified atrial fibrillation: Secondary | ICD-10-CM | POA: Diagnosis not present

## 2017-05-13 DIAGNOSIS — I10 Essential (primary) hypertension: Secondary | ICD-10-CM | POA: Diagnosis not present

## 2017-05-13 DIAGNOSIS — R195 Other fecal abnormalities: Secondary | ICD-10-CM | POA: Diagnosis not present

## 2017-05-13 DIAGNOSIS — Z7901 Long term (current) use of anticoagulants: Secondary | ICD-10-CM | POA: Diagnosis not present

## 2017-05-13 DIAGNOSIS — E785 Hyperlipidemia, unspecified: Secondary | ICD-10-CM | POA: Diagnosis not present

## 2017-05-13 DIAGNOSIS — J440 Chronic obstructive pulmonary disease with acute lower respiratory infection: Secondary | ICD-10-CM | POA: Diagnosis not present

## 2017-05-13 DIAGNOSIS — I129 Hypertensive chronic kidney disease with stage 1 through stage 4 chronic kidney disease, or unspecified chronic kidney disease: Secondary | ICD-10-CM | POA: Diagnosis present

## 2017-05-13 DIAGNOSIS — K222 Esophageal obstruction: Secondary | ICD-10-CM | POA: Diagnosis present

## 2017-05-13 DIAGNOSIS — K5900 Constipation, unspecified: Secondary | ICD-10-CM | POA: Diagnosis not present

## 2017-05-13 DIAGNOSIS — K5730 Diverticulosis of large intestine without perforation or abscess without bleeding: Secondary | ICD-10-CM | POA: Diagnosis not present

## 2017-05-13 DIAGNOSIS — Z87891 Personal history of nicotine dependence: Secondary | ICD-10-CM | POA: Diagnosis not present

## 2017-05-13 DIAGNOSIS — D62 Acute posthemorrhagic anemia: Secondary | ICD-10-CM | POA: Diagnosis not present

## 2017-05-13 DIAGNOSIS — Z79891 Long term (current) use of opiate analgesic: Secondary | ICD-10-CM | POA: Diagnosis not present

## 2017-05-13 DIAGNOSIS — K648 Other hemorrhoids: Secondary | ICD-10-CM | POA: Diagnosis present

## 2017-05-13 DIAGNOSIS — K5791 Diverticulosis of intestine, part unspecified, without perforation or abscess with bleeding: Secondary | ICD-10-CM | POA: Diagnosis not present

## 2017-05-16 DIAGNOSIS — Z950 Presence of cardiac pacemaker: Secondary | ICD-10-CM | POA: Diagnosis not present

## 2017-05-20 DIAGNOSIS — M170 Bilateral primary osteoarthritis of knee: Secondary | ICD-10-CM | POA: Diagnosis not present

## 2017-05-20 DIAGNOSIS — D631 Anemia in chronic kidney disease: Secondary | ICD-10-CM | POA: Diagnosis not present

## 2017-05-20 DIAGNOSIS — I4891 Unspecified atrial fibrillation: Secondary | ICD-10-CM | POA: Diagnosis not present

## 2017-05-20 DIAGNOSIS — K5792 Diverticulitis of intestine, part unspecified, without perforation or abscess without bleeding: Secondary | ICD-10-CM | POA: Diagnosis not present

## 2017-05-20 DIAGNOSIS — N183 Chronic kidney disease, stage 3 (moderate): Secondary | ICD-10-CM | POA: Diagnosis not present

## 2017-05-20 DIAGNOSIS — I129 Hypertensive chronic kidney disease with stage 1 through stage 4 chronic kidney disease, or unspecified chronic kidney disease: Secondary | ICD-10-CM | POA: Diagnosis not present

## 2017-05-22 DIAGNOSIS — I129 Hypertensive chronic kidney disease with stage 1 through stage 4 chronic kidney disease, or unspecified chronic kidney disease: Secondary | ICD-10-CM | POA: Diagnosis not present

## 2017-05-22 DIAGNOSIS — M170 Bilateral primary osteoarthritis of knee: Secondary | ICD-10-CM | POA: Diagnosis not present

## 2017-05-22 DIAGNOSIS — K5792 Diverticulitis of intestine, part unspecified, without perforation or abscess without bleeding: Secondary | ICD-10-CM | POA: Diagnosis not present

## 2017-05-22 DIAGNOSIS — N183 Chronic kidney disease, stage 3 (moderate): Secondary | ICD-10-CM | POA: Diagnosis not present

## 2017-05-22 DIAGNOSIS — D631 Anemia in chronic kidney disease: Secondary | ICD-10-CM | POA: Diagnosis not present

## 2017-05-22 DIAGNOSIS — I4891 Unspecified atrial fibrillation: Secondary | ICD-10-CM | POA: Diagnosis not present

## 2017-05-23 DIAGNOSIS — M170 Bilateral primary osteoarthritis of knee: Secondary | ICD-10-CM | POA: Diagnosis not present

## 2017-05-23 DIAGNOSIS — I129 Hypertensive chronic kidney disease with stage 1 through stage 4 chronic kidney disease, or unspecified chronic kidney disease: Secondary | ICD-10-CM | POA: Diagnosis not present

## 2017-05-23 DIAGNOSIS — N183 Chronic kidney disease, stage 3 (moderate): Secondary | ICD-10-CM | POA: Diagnosis not present

## 2017-05-23 DIAGNOSIS — D631 Anemia in chronic kidney disease: Secondary | ICD-10-CM | POA: Diagnosis not present

## 2017-05-23 DIAGNOSIS — K222 Esophageal obstruction: Secondary | ICD-10-CM | POA: Diagnosis not present

## 2017-05-23 DIAGNOSIS — D62 Acute posthemorrhagic anemia: Secondary | ICD-10-CM | POA: Diagnosis not present

## 2017-05-23 DIAGNOSIS — I4891 Unspecified atrial fibrillation: Secondary | ICD-10-CM | POA: Diagnosis not present

## 2017-05-23 DIAGNOSIS — K5792 Diverticulitis of intestine, part unspecified, without perforation or abscess without bleeding: Secondary | ICD-10-CM | POA: Diagnosis not present

## 2017-05-23 DIAGNOSIS — Z8719 Personal history of other diseases of the digestive system: Secondary | ICD-10-CM | POA: Diagnosis not present

## 2017-05-23 DIAGNOSIS — K5730 Diverticulosis of large intestine without perforation or abscess without bleeding: Secondary | ICD-10-CM | POA: Diagnosis not present

## 2017-05-27 DIAGNOSIS — M170 Bilateral primary osteoarthritis of knee: Secondary | ICD-10-CM | POA: Diagnosis not present

## 2017-05-27 DIAGNOSIS — I129 Hypertensive chronic kidney disease with stage 1 through stage 4 chronic kidney disease, or unspecified chronic kidney disease: Secondary | ICD-10-CM | POA: Diagnosis not present

## 2017-05-27 DIAGNOSIS — K5792 Diverticulitis of intestine, part unspecified, without perforation or abscess without bleeding: Secondary | ICD-10-CM | POA: Diagnosis not present

## 2017-05-27 DIAGNOSIS — N183 Chronic kidney disease, stage 3 (moderate): Secondary | ICD-10-CM | POA: Diagnosis not present

## 2017-05-27 DIAGNOSIS — D631 Anemia in chronic kidney disease: Secondary | ICD-10-CM | POA: Diagnosis not present

## 2017-05-27 DIAGNOSIS — I4891 Unspecified atrial fibrillation: Secondary | ICD-10-CM | POA: Diagnosis not present

## 2017-05-29 DIAGNOSIS — K5792 Diverticulitis of intestine, part unspecified, without perforation or abscess without bleeding: Secondary | ICD-10-CM | POA: Diagnosis not present

## 2017-05-29 DIAGNOSIS — N183 Chronic kidney disease, stage 3 (moderate): Secondary | ICD-10-CM | POA: Diagnosis not present

## 2017-05-29 DIAGNOSIS — M170 Bilateral primary osteoarthritis of knee: Secondary | ICD-10-CM | POA: Diagnosis not present

## 2017-05-29 DIAGNOSIS — D631 Anemia in chronic kidney disease: Secondary | ICD-10-CM | POA: Diagnosis not present

## 2017-05-29 DIAGNOSIS — I129 Hypertensive chronic kidney disease with stage 1 through stage 4 chronic kidney disease, or unspecified chronic kidney disease: Secondary | ICD-10-CM | POA: Diagnosis not present

## 2017-05-29 DIAGNOSIS — I4891 Unspecified atrial fibrillation: Secondary | ICD-10-CM | POA: Diagnosis not present

## 2017-06-04 DIAGNOSIS — D649 Anemia, unspecified: Secondary | ICD-10-CM | POA: Diagnosis not present

## 2017-06-27 DIAGNOSIS — K5730 Diverticulosis of large intestine without perforation or abscess without bleeding: Secondary | ICD-10-CM | POA: Diagnosis not present

## 2017-06-27 DIAGNOSIS — K222 Esophageal obstruction: Secondary | ICD-10-CM | POA: Diagnosis not present

## 2017-07-08 DIAGNOSIS — Z79899 Other long term (current) drug therapy: Secondary | ICD-10-CM | POA: Diagnosis not present

## 2017-07-08 DIAGNOSIS — N183 Chronic kidney disease, stage 3 (moderate): Secondary | ICD-10-CM | POA: Diagnosis not present

## 2017-07-08 DIAGNOSIS — M4800 Spinal stenosis, site unspecified: Secondary | ICD-10-CM | POA: Diagnosis not present

## 2017-07-08 DIAGNOSIS — R29898 Other symptoms and signs involving the musculoskeletal system: Secondary | ICD-10-CM | POA: Diagnosis not present

## 2017-07-08 DIAGNOSIS — R64 Cachexia: Secondary | ICD-10-CM | POA: Diagnosis not present

## 2017-07-08 DIAGNOSIS — E785 Hyperlipidemia, unspecified: Secondary | ICD-10-CM | POA: Diagnosis not present

## 2017-07-08 DIAGNOSIS — R609 Edema, unspecified: Secondary | ICD-10-CM | POA: Diagnosis not present

## 2017-07-08 DIAGNOSIS — Z0000 Encounter for general adult medical examination without abnormal findings: Secondary | ICD-10-CM | POA: Diagnosis not present

## 2017-07-08 DIAGNOSIS — E611 Iron deficiency: Secondary | ICD-10-CM | POA: Diagnosis not present

## 2017-08-16 DIAGNOSIS — Z45010 Encounter for checking and testing of cardiac pacemaker pulse generator [battery]: Secondary | ICD-10-CM | POA: Diagnosis not present

## 2017-08-21 DIAGNOSIS — Z7901 Long term (current) use of anticoagulants: Secondary | ICD-10-CM | POA: Diagnosis not present

## 2017-08-21 DIAGNOSIS — I480 Paroxysmal atrial fibrillation: Secondary | ICD-10-CM | POA: Diagnosis not present

## 2017-08-21 DIAGNOSIS — D649 Anemia, unspecified: Secondary | ICD-10-CM | POA: Diagnosis not present

## 2017-08-21 DIAGNOSIS — Z950 Presence of cardiac pacemaker: Secondary | ICD-10-CM | POA: Diagnosis not present

## 2017-08-21 DIAGNOSIS — E785 Hyperlipidemia, unspecified: Secondary | ICD-10-CM | POA: Diagnosis not present

## 2017-08-21 DIAGNOSIS — I495 Sick sinus syndrome: Secondary | ICD-10-CM | POA: Diagnosis not present

## 2017-09-24 DIAGNOSIS — Z6825 Body mass index (BMI) 25.0-25.9, adult: Secondary | ICD-10-CM | POA: Diagnosis not present

## 2017-09-24 DIAGNOSIS — R609 Edema, unspecified: Secondary | ICD-10-CM | POA: Diagnosis not present

## 2017-09-24 DIAGNOSIS — M159 Polyosteoarthritis, unspecified: Secondary | ICD-10-CM | POA: Diagnosis not present

## 2017-09-24 DIAGNOSIS — N183 Chronic kidney disease, stage 3 (moderate): Secondary | ICD-10-CM | POA: Diagnosis not present

## 2017-10-01 DIAGNOSIS — Z1331 Encounter for screening for depression: Secondary | ICD-10-CM | POA: Diagnosis not present

## 2017-10-01 DIAGNOSIS — Z9181 History of falling: Secondary | ICD-10-CM | POA: Diagnosis not present

## 2017-10-01 DIAGNOSIS — N183 Chronic kidney disease, stage 3 (moderate): Secondary | ICD-10-CM | POA: Diagnosis not present

## 2017-10-01 DIAGNOSIS — R609 Edema, unspecified: Secondary | ICD-10-CM | POA: Diagnosis not present

## 2017-11-12 DIAGNOSIS — J181 Lobar pneumonia, unspecified organism: Secondary | ICD-10-CM | POA: Diagnosis not present

## 2017-11-15 DIAGNOSIS — Z950 Presence of cardiac pacemaker: Secondary | ICD-10-CM | POA: Diagnosis not present

## 2017-11-22 DIAGNOSIS — J181 Lobar pneumonia, unspecified organism: Secondary | ICD-10-CM | POA: Diagnosis not present

## 2017-12-04 DIAGNOSIS — R609 Edema, unspecified: Secondary | ICD-10-CM | POA: Diagnosis not present

## 2017-12-04 DIAGNOSIS — N183 Chronic kidney disease, stage 3 (moderate): Secondary | ICD-10-CM | POA: Diagnosis not present

## 2017-12-04 DIAGNOSIS — M159 Polyosteoarthritis, unspecified: Secondary | ICD-10-CM | POA: Diagnosis not present

## 2017-12-04 DIAGNOSIS — R29898 Other symptoms and signs involving the musculoskeletal system: Secondary | ICD-10-CM | POA: Diagnosis not present

## 2017-12-04 DIAGNOSIS — Z23 Encounter for immunization: Secondary | ICD-10-CM | POA: Diagnosis not present

## 2017-12-04 DIAGNOSIS — M4800 Spinal stenosis, site unspecified: Secondary | ICD-10-CM | POA: Diagnosis not present

## 2018-01-27 DIAGNOSIS — K644 Residual hemorrhoidal skin tags: Secondary | ICD-10-CM | POA: Diagnosis not present

## 2018-01-27 DIAGNOSIS — K921 Melena: Secondary | ICD-10-CM | POA: Diagnosis not present

## 2018-01-27 DIAGNOSIS — K648 Other hemorrhoids: Secondary | ICD-10-CM | POA: Diagnosis not present

## 2018-02-05 DIAGNOSIS — D649 Anemia, unspecified: Secondary | ICD-10-CM | POA: Diagnosis not present

## 2018-02-12 DIAGNOSIS — K219 Gastro-esophageal reflux disease without esophagitis: Secondary | ICD-10-CM | POA: Diagnosis not present

## 2018-02-12 DIAGNOSIS — D649 Anemia, unspecified: Secondary | ICD-10-CM | POA: Diagnosis not present

## 2018-02-12 DIAGNOSIS — D510 Vitamin B12 deficiency anemia due to intrinsic factor deficiency: Secondary | ICD-10-CM | POA: Diagnosis not present

## 2018-02-12 DIAGNOSIS — K5730 Diverticulosis of large intestine without perforation or abscess without bleeding: Secondary | ICD-10-CM | POA: Diagnosis not present

## 2018-02-12 DIAGNOSIS — K921 Melena: Secondary | ICD-10-CM | POA: Diagnosis not present

## 2018-02-14 DIAGNOSIS — Z950 Presence of cardiac pacemaker: Secondary | ICD-10-CM | POA: Diagnosis not present

## 2018-02-21 DIAGNOSIS — D510 Vitamin B12 deficiency anemia due to intrinsic factor deficiency: Secondary | ICD-10-CM | POA: Diagnosis not present

## 2018-02-24 DIAGNOSIS — D649 Anemia, unspecified: Secondary | ICD-10-CM | POA: Diagnosis not present

## 2018-02-24 DIAGNOSIS — I495 Sick sinus syndrome: Secondary | ICD-10-CM | POA: Diagnosis not present

## 2018-02-24 DIAGNOSIS — E785 Hyperlipidemia, unspecified: Secondary | ICD-10-CM | POA: Diagnosis not present

## 2018-02-24 DIAGNOSIS — Z950 Presence of cardiac pacemaker: Secondary | ICD-10-CM | POA: Diagnosis not present

## 2018-02-24 DIAGNOSIS — I480 Paroxysmal atrial fibrillation: Secondary | ICD-10-CM | POA: Diagnosis not present

## 2018-02-24 DIAGNOSIS — Z7901 Long term (current) use of anticoagulants: Secondary | ICD-10-CM | POA: Diagnosis not present

## 2018-02-27 DIAGNOSIS — D510 Vitamin B12 deficiency anemia due to intrinsic factor deficiency: Secondary | ICD-10-CM | POA: Diagnosis not present

## 2018-03-06 DIAGNOSIS — D510 Vitamin B12 deficiency anemia due to intrinsic factor deficiency: Secondary | ICD-10-CM | POA: Diagnosis not present

## 2018-03-14 DIAGNOSIS — D519 Vitamin B12 deficiency anemia, unspecified: Secondary | ICD-10-CM | POA: Diagnosis not present

## 2018-03-17 DIAGNOSIS — I4891 Unspecified atrial fibrillation: Secondary | ICD-10-CM | POA: Diagnosis not present

## 2018-03-17 DIAGNOSIS — K5730 Diverticulosis of large intestine without perforation or abscess without bleeding: Secondary | ICD-10-CM | POA: Diagnosis not present

## 2018-03-17 DIAGNOSIS — K921 Melena: Secondary | ICD-10-CM | POA: Diagnosis not present

## 2018-03-17 DIAGNOSIS — R310 Gross hematuria: Secondary | ICD-10-CM | POA: Diagnosis not present

## 2018-03-17 DIAGNOSIS — R3129 Other microscopic hematuria: Secondary | ICD-10-CM | POA: Diagnosis not present

## 2018-03-17 DIAGNOSIS — K222 Esophageal obstruction: Secondary | ICD-10-CM | POA: Diagnosis not present

## 2018-03-17 DIAGNOSIS — E7800 Pure hypercholesterolemia, unspecified: Secondary | ICD-10-CM | POA: Diagnosis not present

## 2018-03-17 DIAGNOSIS — J449 Chronic obstructive pulmonary disease, unspecified: Secondary | ICD-10-CM | POA: Diagnosis not present

## 2018-03-17 DIAGNOSIS — Z8673 Personal history of transient ischemic attack (TIA), and cerebral infarction without residual deficits: Secondary | ICD-10-CM | POA: Diagnosis not present

## 2018-03-17 DIAGNOSIS — N183 Chronic kidney disease, stage 3 (moderate): Secondary | ICD-10-CM | POA: Diagnosis not present

## 2018-03-17 DIAGNOSIS — D509 Iron deficiency anemia, unspecified: Secondary | ICD-10-CM | POA: Diagnosis not present

## 2018-03-17 DIAGNOSIS — Z7901 Long term (current) use of anticoagulants: Secondary | ICD-10-CM | POA: Diagnosis not present

## 2018-03-17 DIAGNOSIS — K922 Gastrointestinal hemorrhage, unspecified: Secondary | ICD-10-CM | POA: Diagnosis not present

## 2018-03-17 DIAGNOSIS — Z79899 Other long term (current) drug therapy: Secondary | ICD-10-CM | POA: Diagnosis not present

## 2018-03-17 DIAGNOSIS — I129 Hypertensive chronic kidney disease with stage 1 through stage 4 chronic kidney disease, or unspecified chronic kidney disease: Secondary | ICD-10-CM | POA: Diagnosis not present

## 2018-03-17 DIAGNOSIS — N390 Urinary tract infection, site not specified: Secondary | ICD-10-CM | POA: Diagnosis not present

## 2018-03-17 DIAGNOSIS — Z950 Presence of cardiac pacemaker: Secondary | ICD-10-CM | POA: Diagnosis not present

## 2018-03-17 DIAGNOSIS — R1031 Right lower quadrant pain: Secondary | ICD-10-CM | POA: Diagnosis not present

## 2018-03-17 DIAGNOSIS — K5790 Diverticulosis of intestine, part unspecified, without perforation or abscess without bleeding: Secondary | ICD-10-CM | POA: Diagnosis not present

## 2018-03-17 DIAGNOSIS — R5381 Other malaise: Secondary | ICD-10-CM | POA: Diagnosis not present

## 2018-03-17 DIAGNOSIS — K219 Gastro-esophageal reflux disease without esophagitis: Secondary | ICD-10-CM | POA: Diagnosis not present

## 2018-03-17 DIAGNOSIS — D62 Acute posthemorrhagic anemia: Secondary | ICD-10-CM | POA: Diagnosis not present

## 2018-03-18 DIAGNOSIS — K922 Gastrointestinal hemorrhage, unspecified: Secondary | ICD-10-CM | POA: Diagnosis not present

## 2018-03-18 DIAGNOSIS — D62 Acute posthemorrhagic anemia: Secondary | ICD-10-CM | POA: Diagnosis not present

## 2018-03-18 DIAGNOSIS — K5790 Diverticulosis of intestine, part unspecified, without perforation or abscess without bleeding: Secondary | ICD-10-CM | POA: Diagnosis not present

## 2018-03-18 DIAGNOSIS — K920 Hematemesis: Secondary | ICD-10-CM | POA: Diagnosis not present

## 2018-03-18 DIAGNOSIS — R310 Gross hematuria: Secondary | ICD-10-CM | POA: Diagnosis not present

## 2018-03-18 DIAGNOSIS — I4891 Unspecified atrial fibrillation: Secondary | ICD-10-CM | POA: Diagnosis not present

## 2018-03-18 DIAGNOSIS — K921 Melena: Secondary | ICD-10-CM | POA: Diagnosis not present

## 2018-03-18 DIAGNOSIS — Z7901 Long term (current) use of anticoagulants: Secondary | ICD-10-CM | POA: Diagnosis not present

## 2018-03-18 DIAGNOSIS — I10 Essential (primary) hypertension: Secondary | ICD-10-CM | POA: Diagnosis not present

## 2018-03-19 DIAGNOSIS — K5790 Diverticulosis of intestine, part unspecified, without perforation or abscess without bleeding: Secondary | ICD-10-CM | POA: Diagnosis not present

## 2018-03-19 DIAGNOSIS — I10 Essential (primary) hypertension: Secondary | ICD-10-CM | POA: Diagnosis not present

## 2018-03-19 DIAGNOSIS — D62 Acute posthemorrhagic anemia: Secondary | ICD-10-CM | POA: Diagnosis not present

## 2018-03-19 DIAGNOSIS — Z7901 Long term (current) use of anticoagulants: Secondary | ICD-10-CM | POA: Diagnosis not present

## 2018-03-19 DIAGNOSIS — K920 Hematemesis: Secondary | ICD-10-CM | POA: Diagnosis not present

## 2018-03-19 DIAGNOSIS — R3129 Other microscopic hematuria: Secondary | ICD-10-CM | POA: Diagnosis not present

## 2018-03-19 DIAGNOSIS — N390 Urinary tract infection, site not specified: Secondary | ICD-10-CM | POA: Diagnosis not present

## 2018-03-19 DIAGNOSIS — K921 Melena: Secondary | ICD-10-CM | POA: Diagnosis not present

## 2018-03-19 DIAGNOSIS — I4891 Unspecified atrial fibrillation: Secondary | ICD-10-CM | POA: Diagnosis not present

## 2018-03-26 DIAGNOSIS — K922 Gastrointestinal hemorrhage, unspecified: Secondary | ICD-10-CM | POA: Diagnosis not present

## 2018-03-26 DIAGNOSIS — I10 Essential (primary) hypertension: Secondary | ICD-10-CM | POA: Diagnosis not present

## 2018-03-26 DIAGNOSIS — Z8719 Personal history of other diseases of the digestive system: Secondary | ICD-10-CM | POA: Diagnosis not present

## 2018-03-26 DIAGNOSIS — D649 Anemia, unspecified: Secondary | ICD-10-CM | POA: Diagnosis not present

## 2018-03-26 DIAGNOSIS — I4891 Unspecified atrial fibrillation: Secondary | ICD-10-CM | POA: Diagnosis not present

## 2018-04-01 DIAGNOSIS — J189 Pneumonia, unspecified organism: Secondary | ICD-10-CM | POA: Diagnosis not present

## 2018-04-01 DIAGNOSIS — Z6827 Body mass index (BMI) 27.0-27.9, adult: Secondary | ICD-10-CM | POA: Diagnosis not present

## 2018-04-09 DIAGNOSIS — E559 Vitamin D deficiency, unspecified: Secondary | ICD-10-CM | POA: Diagnosis not present

## 2018-04-09 DIAGNOSIS — K921 Melena: Secondary | ICD-10-CM | POA: Diagnosis not present

## 2018-04-09 DIAGNOSIS — K5790 Diverticulosis of intestine, part unspecified, without perforation or abscess without bleeding: Secondary | ICD-10-CM | POA: Diagnosis not present

## 2018-04-09 DIAGNOSIS — K219 Gastro-esophageal reflux disease without esophagitis: Secondary | ICD-10-CM | POA: Diagnosis not present

## 2018-04-09 DIAGNOSIS — D500 Iron deficiency anemia secondary to blood loss (chronic): Secondary | ICD-10-CM | POA: Diagnosis not present

## 2018-05-12 DIAGNOSIS — D519 Vitamin B12 deficiency anemia, unspecified: Secondary | ICD-10-CM | POA: Diagnosis not present

## 2018-06-05 DIAGNOSIS — D509 Iron deficiency anemia, unspecified: Secondary | ICD-10-CM | POA: Diagnosis not present

## 2018-06-12 DIAGNOSIS — Z87448 Personal history of other diseases of urinary system: Secondary | ICD-10-CM | POA: Insufficient documentation

## 2018-06-12 DIAGNOSIS — I10 Essential (primary) hypertension: Secondary | ICD-10-CM | POA: Insufficient documentation

## 2018-06-12 DIAGNOSIS — R609 Edema, unspecified: Secondary | ICD-10-CM | POA: Diagnosis not present

## 2018-06-12 DIAGNOSIS — Z950 Presence of cardiac pacemaker: Secondary | ICD-10-CM | POA: Diagnosis not present

## 2018-06-12 DIAGNOSIS — Z7901 Long term (current) use of anticoagulants: Secondary | ICD-10-CM | POA: Diagnosis not present

## 2018-06-12 DIAGNOSIS — E559 Vitamin D deficiency, unspecified: Secondary | ICD-10-CM | POA: Diagnosis not present

## 2018-06-12 DIAGNOSIS — Z45018 Encounter for adjustment and management of other part of cardiac pacemaker: Secondary | ICD-10-CM | POA: Diagnosis not present

## 2018-06-17 DIAGNOSIS — E785 Hyperlipidemia, unspecified: Secondary | ICD-10-CM | POA: Diagnosis not present

## 2018-06-17 DIAGNOSIS — I10 Essential (primary) hypertension: Secondary | ICD-10-CM | POA: Diagnosis not present

## 2018-06-17 DIAGNOSIS — Z950 Presence of cardiac pacemaker: Secondary | ICD-10-CM | POA: Diagnosis not present

## 2018-06-17 DIAGNOSIS — I480 Paroxysmal atrial fibrillation: Secondary | ICD-10-CM | POA: Diagnosis not present

## 2018-06-17 DIAGNOSIS — Z45018 Encounter for adjustment and management of other part of cardiac pacemaker: Secondary | ICD-10-CM | POA: Diagnosis not present

## 2018-06-17 DIAGNOSIS — R609 Edema, unspecified: Secondary | ICD-10-CM | POA: Diagnosis not present

## 2018-06-17 DIAGNOSIS — Z45010 Encounter for checking and testing of cardiac pacemaker pulse generator [battery]: Secondary | ICD-10-CM | POA: Diagnosis not present

## 2018-06-17 DIAGNOSIS — I495 Sick sinus syndrome: Secondary | ICD-10-CM | POA: Diagnosis not present

## 2018-06-17 DIAGNOSIS — Z87448 Personal history of other diseases of urinary system: Secondary | ICD-10-CM | POA: Diagnosis not present

## 2018-06-24 DIAGNOSIS — Z950 Presence of cardiac pacemaker: Secondary | ICD-10-CM | POA: Diagnosis not present

## 2018-06-24 DIAGNOSIS — Z45010 Encounter for checking and testing of cardiac pacemaker pulse generator [battery]: Secondary | ICD-10-CM | POA: Diagnosis not present

## 2018-06-24 DIAGNOSIS — I10 Essential (primary) hypertension: Secondary | ICD-10-CM | POA: Diagnosis not present

## 2018-06-24 DIAGNOSIS — I480 Paroxysmal atrial fibrillation: Secondary | ICD-10-CM | POA: Diagnosis not present

## 2018-07-01 DIAGNOSIS — I495 Sick sinus syndrome: Secondary | ICD-10-CM | POA: Diagnosis not present

## 2018-07-01 DIAGNOSIS — Z1159 Encounter for screening for other viral diseases: Secondary | ICD-10-CM | POA: Diagnosis not present

## 2018-07-01 DIAGNOSIS — Z01818 Encounter for other preprocedural examination: Secondary | ICD-10-CM | POA: Diagnosis not present

## 2018-07-01 DIAGNOSIS — Z950 Presence of cardiac pacemaker: Secondary | ICD-10-CM | POA: Diagnosis not present

## 2018-07-01 DIAGNOSIS — Z01812 Encounter for preprocedural laboratory examination: Secondary | ICD-10-CM | POA: Diagnosis not present

## 2018-07-07 DIAGNOSIS — E785 Hyperlipidemia, unspecified: Secondary | ICD-10-CM | POA: Diagnosis not present

## 2018-07-07 DIAGNOSIS — J449 Chronic obstructive pulmonary disease, unspecified: Secondary | ICD-10-CM | POA: Diagnosis not present

## 2018-07-07 DIAGNOSIS — Z45010 Encounter for checking and testing of cardiac pacemaker pulse generator [battery]: Secondary | ICD-10-CM | POA: Diagnosis not present

## 2018-07-07 DIAGNOSIS — Z7901 Long term (current) use of anticoagulants: Secondary | ICD-10-CM | POA: Diagnosis not present

## 2018-07-07 DIAGNOSIS — M25842 Other specified joint disorders, left hand: Secondary | ICD-10-CM | POA: Diagnosis not present

## 2018-07-07 DIAGNOSIS — D649 Anemia, unspecified: Secondary | ICD-10-CM | POA: Diagnosis not present

## 2018-07-07 DIAGNOSIS — T82111A Breakdown (mechanical) of cardiac pulse generator (battery), initial encounter: Secondary | ICD-10-CM | POA: Diagnosis not present

## 2018-07-07 DIAGNOSIS — Z950 Presence of cardiac pacemaker: Secondary | ICD-10-CM | POA: Diagnosis not present

## 2018-07-07 DIAGNOSIS — I10 Essential (primary) hypertension: Secondary | ICD-10-CM | POA: Diagnosis not present

## 2018-07-08 DIAGNOSIS — D649 Anemia, unspecified: Secondary | ICD-10-CM | POA: Diagnosis not present

## 2018-07-08 DIAGNOSIS — Z7901 Long term (current) use of anticoagulants: Secondary | ICD-10-CM | POA: Diagnosis not present

## 2018-07-08 DIAGNOSIS — I10 Essential (primary) hypertension: Secondary | ICD-10-CM | POA: Diagnosis not present

## 2018-07-08 DIAGNOSIS — E785 Hyperlipidemia, unspecified: Secondary | ICD-10-CM | POA: Diagnosis not present

## 2018-07-08 DIAGNOSIS — J449 Chronic obstructive pulmonary disease, unspecified: Secondary | ICD-10-CM | POA: Diagnosis not present

## 2018-07-08 DIAGNOSIS — Z45010 Encounter for checking and testing of cardiac pacemaker pulse generator [battery]: Secondary | ICD-10-CM | POA: Diagnosis not present

## 2018-07-10 DIAGNOSIS — I4891 Unspecified atrial fibrillation: Secondary | ICD-10-CM | POA: Diagnosis not present

## 2018-07-10 DIAGNOSIS — Z48812 Encounter for surgical aftercare following surgery on the circulatory system: Secondary | ICD-10-CM | POA: Diagnosis not present

## 2018-07-10 DIAGNOSIS — Z950 Presence of cardiac pacemaker: Secondary | ICD-10-CM | POA: Diagnosis not present

## 2018-07-10 DIAGNOSIS — N183 Chronic kidney disease, stage 3 (moderate): Secondary | ICD-10-CM | POA: Diagnosis not present

## 2018-07-10 DIAGNOSIS — I495 Sick sinus syndrome: Secondary | ICD-10-CM | POA: Diagnosis not present

## 2018-07-10 DIAGNOSIS — J449 Chronic obstructive pulmonary disease, unspecified: Secondary | ICD-10-CM | POA: Diagnosis not present

## 2018-07-10 DIAGNOSIS — E785 Hyperlipidemia, unspecified: Secondary | ICD-10-CM | POA: Diagnosis not present

## 2018-07-15 DIAGNOSIS — I495 Sick sinus syndrome: Secondary | ICD-10-CM | POA: Diagnosis not present

## 2018-07-15 DIAGNOSIS — N183 Chronic kidney disease, stage 3 (moderate): Secondary | ICD-10-CM | POA: Diagnosis not present

## 2018-07-15 DIAGNOSIS — J449 Chronic obstructive pulmonary disease, unspecified: Secondary | ICD-10-CM | POA: Diagnosis not present

## 2018-07-15 DIAGNOSIS — Z48812 Encounter for surgical aftercare following surgery on the circulatory system: Secondary | ICD-10-CM | POA: Diagnosis not present

## 2018-07-15 DIAGNOSIS — E785 Hyperlipidemia, unspecified: Secondary | ICD-10-CM | POA: Diagnosis not present

## 2018-07-15 DIAGNOSIS — I4891 Unspecified atrial fibrillation: Secondary | ICD-10-CM | POA: Diagnosis not present

## 2018-07-16 DIAGNOSIS — Z7901 Long term (current) use of anticoagulants: Secondary | ICD-10-CM | POA: Diagnosis not present

## 2018-07-16 DIAGNOSIS — Z950 Presence of cardiac pacemaker: Secondary | ICD-10-CM | POA: Diagnosis not present

## 2018-07-16 DIAGNOSIS — I480 Paroxysmal atrial fibrillation: Secondary | ICD-10-CM | POA: Diagnosis not present

## 2018-07-16 DIAGNOSIS — E7849 Other hyperlipidemia: Secondary | ICD-10-CM | POA: Diagnosis not present

## 2018-07-16 DIAGNOSIS — I10 Essential (primary) hypertension: Secondary | ICD-10-CM | POA: Diagnosis not present

## 2018-07-17 DIAGNOSIS — D509 Iron deficiency anemia, unspecified: Secondary | ICD-10-CM | POA: Diagnosis not present

## 2018-07-17 DIAGNOSIS — Z48812 Encounter for surgical aftercare following surgery on the circulatory system: Secondary | ICD-10-CM | POA: Diagnosis not present

## 2018-07-17 DIAGNOSIS — E785 Hyperlipidemia, unspecified: Secondary | ICD-10-CM | POA: Diagnosis not present

## 2018-07-17 DIAGNOSIS — I10 Essential (primary) hypertension: Secondary | ICD-10-CM | POA: Diagnosis not present

## 2018-07-17 DIAGNOSIS — M159 Polyosteoarthritis, unspecified: Secondary | ICD-10-CM | POA: Diagnosis not present

## 2018-07-17 DIAGNOSIS — Z0000 Encounter for general adult medical examination without abnormal findings: Secondary | ICD-10-CM | POA: Diagnosis not present

## 2018-07-17 DIAGNOSIS — E559 Vitamin D deficiency, unspecified: Secondary | ICD-10-CM | POA: Diagnosis not present

## 2018-07-17 DIAGNOSIS — N183 Chronic kidney disease, stage 3 (moderate): Secondary | ICD-10-CM | POA: Diagnosis not present

## 2018-07-17 DIAGNOSIS — I495 Sick sinus syndrome: Secondary | ICD-10-CM | POA: Diagnosis not present

## 2018-07-17 DIAGNOSIS — Z79899 Other long term (current) drug therapy: Secondary | ICD-10-CM | POA: Diagnosis not present

## 2018-07-17 DIAGNOSIS — K219 Gastro-esophageal reflux disease without esophagitis: Secondary | ICD-10-CM | POA: Diagnosis not present

## 2018-07-17 DIAGNOSIS — J449 Chronic obstructive pulmonary disease, unspecified: Secondary | ICD-10-CM | POA: Diagnosis not present

## 2018-07-17 DIAGNOSIS — I4891 Unspecified atrial fibrillation: Secondary | ICD-10-CM | POA: Diagnosis not present

## 2018-07-21 DIAGNOSIS — J449 Chronic obstructive pulmonary disease, unspecified: Secondary | ICD-10-CM | POA: Diagnosis not present

## 2018-07-21 DIAGNOSIS — N183 Chronic kidney disease, stage 3 (moderate): Secondary | ICD-10-CM | POA: Diagnosis not present

## 2018-07-21 DIAGNOSIS — I4891 Unspecified atrial fibrillation: Secondary | ICD-10-CM | POA: Diagnosis not present

## 2018-07-21 DIAGNOSIS — Z48812 Encounter for surgical aftercare following surgery on the circulatory system: Secondary | ICD-10-CM | POA: Diagnosis not present

## 2018-07-21 DIAGNOSIS — E785 Hyperlipidemia, unspecified: Secondary | ICD-10-CM | POA: Diagnosis not present

## 2018-07-21 DIAGNOSIS — I495 Sick sinus syndrome: Secondary | ICD-10-CM | POA: Diagnosis not present

## 2018-07-23 DIAGNOSIS — J449 Chronic obstructive pulmonary disease, unspecified: Secondary | ICD-10-CM | POA: Diagnosis not present

## 2018-07-23 DIAGNOSIS — D500 Iron deficiency anemia secondary to blood loss (chronic): Secondary | ICD-10-CM | POA: Diagnosis not present

## 2018-07-23 DIAGNOSIS — K5790 Diverticulosis of intestine, part unspecified, without perforation or abscess without bleeding: Secondary | ICD-10-CM | POA: Diagnosis not present

## 2018-07-23 DIAGNOSIS — I4891 Unspecified atrial fibrillation: Secondary | ICD-10-CM | POA: Diagnosis not present

## 2018-07-23 DIAGNOSIS — I495 Sick sinus syndrome: Secondary | ICD-10-CM | POA: Diagnosis not present

## 2018-07-23 DIAGNOSIS — E538 Deficiency of other specified B group vitamins: Secondary | ICD-10-CM | POA: Diagnosis not present

## 2018-07-23 DIAGNOSIS — Z48812 Encounter for surgical aftercare following surgery on the circulatory system: Secondary | ICD-10-CM | POA: Diagnosis not present

## 2018-07-23 DIAGNOSIS — E785 Hyperlipidemia, unspecified: Secondary | ICD-10-CM | POA: Diagnosis not present

## 2018-07-23 DIAGNOSIS — K921 Melena: Secondary | ICD-10-CM | POA: Diagnosis not present

## 2018-07-23 DIAGNOSIS — N183 Chronic kidney disease, stage 3 (moderate): Secondary | ICD-10-CM | POA: Diagnosis not present

## 2018-07-23 DIAGNOSIS — K219 Gastro-esophageal reflux disease without esophagitis: Secondary | ICD-10-CM | POA: Diagnosis not present

## 2018-07-24 DIAGNOSIS — I4891 Unspecified atrial fibrillation: Secondary | ICD-10-CM | POA: Diagnosis not present

## 2018-07-24 DIAGNOSIS — J449 Chronic obstructive pulmonary disease, unspecified: Secondary | ICD-10-CM | POA: Diagnosis not present

## 2018-07-24 DIAGNOSIS — Z48812 Encounter for surgical aftercare following surgery on the circulatory system: Secondary | ICD-10-CM | POA: Diagnosis not present

## 2018-07-24 DIAGNOSIS — I495 Sick sinus syndrome: Secondary | ICD-10-CM | POA: Diagnosis not present

## 2018-07-24 DIAGNOSIS — E785 Hyperlipidemia, unspecified: Secondary | ICD-10-CM | POA: Diagnosis not present

## 2018-07-24 DIAGNOSIS — N183 Chronic kidney disease, stage 3 (moderate): Secondary | ICD-10-CM | POA: Diagnosis not present

## 2018-07-28 DIAGNOSIS — I495 Sick sinus syndrome: Secondary | ICD-10-CM | POA: Diagnosis not present

## 2018-07-28 DIAGNOSIS — N183 Chronic kidney disease, stage 3 (moderate): Secondary | ICD-10-CM | POA: Diagnosis not present

## 2018-07-28 DIAGNOSIS — I4891 Unspecified atrial fibrillation: Secondary | ICD-10-CM | POA: Diagnosis not present

## 2018-07-28 DIAGNOSIS — J449 Chronic obstructive pulmonary disease, unspecified: Secondary | ICD-10-CM | POA: Diagnosis not present

## 2018-07-28 DIAGNOSIS — E785 Hyperlipidemia, unspecified: Secondary | ICD-10-CM | POA: Diagnosis not present

## 2018-07-28 DIAGNOSIS — Z48812 Encounter for surgical aftercare following surgery on the circulatory system: Secondary | ICD-10-CM | POA: Diagnosis not present

## 2018-07-29 DIAGNOSIS — E785 Hyperlipidemia, unspecified: Secondary | ICD-10-CM | POA: Diagnosis not present

## 2018-07-29 DIAGNOSIS — I495 Sick sinus syndrome: Secondary | ICD-10-CM | POA: Diagnosis not present

## 2018-07-29 DIAGNOSIS — Z48812 Encounter for surgical aftercare following surgery on the circulatory system: Secondary | ICD-10-CM | POA: Diagnosis not present

## 2018-07-29 DIAGNOSIS — J449 Chronic obstructive pulmonary disease, unspecified: Secondary | ICD-10-CM | POA: Diagnosis not present

## 2018-07-29 DIAGNOSIS — N183 Chronic kidney disease, stage 3 (moderate): Secondary | ICD-10-CM | POA: Diagnosis not present

## 2018-07-29 DIAGNOSIS — I4891 Unspecified atrial fibrillation: Secondary | ICD-10-CM | POA: Diagnosis not present

## 2018-07-31 DIAGNOSIS — I495 Sick sinus syndrome: Secondary | ICD-10-CM | POA: Diagnosis not present

## 2018-07-31 DIAGNOSIS — E785 Hyperlipidemia, unspecified: Secondary | ICD-10-CM | POA: Diagnosis not present

## 2018-07-31 DIAGNOSIS — Z48812 Encounter for surgical aftercare following surgery on the circulatory system: Secondary | ICD-10-CM | POA: Diagnosis not present

## 2018-07-31 DIAGNOSIS — N183 Chronic kidney disease, stage 3 (moderate): Secondary | ICD-10-CM | POA: Diagnosis not present

## 2018-07-31 DIAGNOSIS — J449 Chronic obstructive pulmonary disease, unspecified: Secondary | ICD-10-CM | POA: Diagnosis not present

## 2018-07-31 DIAGNOSIS — I4891 Unspecified atrial fibrillation: Secondary | ICD-10-CM | POA: Diagnosis not present

## 2018-08-01 DIAGNOSIS — N3091 Cystitis, unspecified with hematuria: Secondary | ICD-10-CM | POA: Diagnosis not present

## 2018-08-01 DIAGNOSIS — Z5181 Encounter for therapeutic drug level monitoring: Secondary | ICD-10-CM | POA: Diagnosis not present

## 2018-08-01 DIAGNOSIS — Z7901 Long term (current) use of anticoagulants: Secondary | ICD-10-CM | POA: Diagnosis not present

## 2018-08-05 DIAGNOSIS — J449 Chronic obstructive pulmonary disease, unspecified: Secondary | ICD-10-CM | POA: Diagnosis not present

## 2018-08-05 DIAGNOSIS — I4891 Unspecified atrial fibrillation: Secondary | ICD-10-CM | POA: Diagnosis not present

## 2018-08-05 DIAGNOSIS — N183 Chronic kidney disease, stage 3 (moderate): Secondary | ICD-10-CM | POA: Diagnosis not present

## 2018-08-05 DIAGNOSIS — I495 Sick sinus syndrome: Secondary | ICD-10-CM | POA: Diagnosis not present

## 2018-08-05 DIAGNOSIS — E785 Hyperlipidemia, unspecified: Secondary | ICD-10-CM | POA: Diagnosis not present

## 2018-08-05 DIAGNOSIS — Z48812 Encounter for surgical aftercare following surgery on the circulatory system: Secondary | ICD-10-CM | POA: Diagnosis not present

## 2018-08-06 DIAGNOSIS — E785 Hyperlipidemia, unspecified: Secondary | ICD-10-CM | POA: Diagnosis not present

## 2018-08-06 DIAGNOSIS — I495 Sick sinus syndrome: Secondary | ICD-10-CM | POA: Diagnosis not present

## 2018-08-06 DIAGNOSIS — Z48812 Encounter for surgical aftercare following surgery on the circulatory system: Secondary | ICD-10-CM | POA: Diagnosis not present

## 2018-08-06 DIAGNOSIS — J449 Chronic obstructive pulmonary disease, unspecified: Secondary | ICD-10-CM | POA: Diagnosis not present

## 2018-08-06 DIAGNOSIS — N183 Chronic kidney disease, stage 3 (moderate): Secondary | ICD-10-CM | POA: Diagnosis not present

## 2018-08-06 DIAGNOSIS — I4891 Unspecified atrial fibrillation: Secondary | ICD-10-CM | POA: Diagnosis not present

## 2018-08-07 DIAGNOSIS — Z48812 Encounter for surgical aftercare following surgery on the circulatory system: Secondary | ICD-10-CM | POA: Diagnosis not present

## 2018-08-07 DIAGNOSIS — N183 Chronic kidney disease, stage 3 (moderate): Secondary | ICD-10-CM | POA: Diagnosis not present

## 2018-08-07 DIAGNOSIS — E785 Hyperlipidemia, unspecified: Secondary | ICD-10-CM | POA: Diagnosis not present

## 2018-08-07 DIAGNOSIS — I4891 Unspecified atrial fibrillation: Secondary | ICD-10-CM | POA: Diagnosis not present

## 2018-08-07 DIAGNOSIS — I495 Sick sinus syndrome: Secondary | ICD-10-CM | POA: Diagnosis not present

## 2018-08-07 DIAGNOSIS — J449 Chronic obstructive pulmonary disease, unspecified: Secondary | ICD-10-CM | POA: Diagnosis not present

## 2018-08-08 DIAGNOSIS — I495 Sick sinus syndrome: Secondary | ICD-10-CM | POA: Diagnosis not present

## 2018-08-08 DIAGNOSIS — Z45018 Encounter for adjustment and management of other part of cardiac pacemaker: Secondary | ICD-10-CM | POA: Diagnosis not present

## 2018-08-09 DIAGNOSIS — N183 Chronic kidney disease, stage 3 (moderate): Secondary | ICD-10-CM | POA: Diagnosis not present

## 2018-08-09 DIAGNOSIS — Z950 Presence of cardiac pacemaker: Secondary | ICD-10-CM | POA: Diagnosis not present

## 2018-08-09 DIAGNOSIS — J449 Chronic obstructive pulmonary disease, unspecified: Secondary | ICD-10-CM | POA: Diagnosis not present

## 2018-08-09 DIAGNOSIS — E785 Hyperlipidemia, unspecified: Secondary | ICD-10-CM | POA: Diagnosis not present

## 2018-08-09 DIAGNOSIS — I4891 Unspecified atrial fibrillation: Secondary | ICD-10-CM | POA: Diagnosis not present

## 2018-08-09 DIAGNOSIS — Z48812 Encounter for surgical aftercare following surgery on the circulatory system: Secondary | ICD-10-CM | POA: Diagnosis not present

## 2018-08-09 DIAGNOSIS — I495 Sick sinus syndrome: Secondary | ICD-10-CM | POA: Diagnosis not present

## 2018-08-11 DIAGNOSIS — N183 Chronic kidney disease, stage 3 (moderate): Secondary | ICD-10-CM | POA: Diagnosis not present

## 2018-08-11 DIAGNOSIS — E785 Hyperlipidemia, unspecified: Secondary | ICD-10-CM | POA: Diagnosis not present

## 2018-08-11 DIAGNOSIS — Z48812 Encounter for surgical aftercare following surgery on the circulatory system: Secondary | ICD-10-CM | POA: Diagnosis not present

## 2018-08-11 DIAGNOSIS — I4891 Unspecified atrial fibrillation: Secondary | ICD-10-CM | POA: Diagnosis not present

## 2018-08-11 DIAGNOSIS — I495 Sick sinus syndrome: Secondary | ICD-10-CM | POA: Diagnosis not present

## 2018-08-11 DIAGNOSIS — J449 Chronic obstructive pulmonary disease, unspecified: Secondary | ICD-10-CM | POA: Diagnosis not present

## 2018-08-13 DIAGNOSIS — Z48812 Encounter for surgical aftercare following surgery on the circulatory system: Secondary | ICD-10-CM | POA: Diagnosis not present

## 2018-08-13 DIAGNOSIS — I495 Sick sinus syndrome: Secondary | ICD-10-CM | POA: Diagnosis not present

## 2018-08-13 DIAGNOSIS — N183 Chronic kidney disease, stage 3 (moderate): Secondary | ICD-10-CM | POA: Diagnosis not present

## 2018-08-13 DIAGNOSIS — E785 Hyperlipidemia, unspecified: Secondary | ICD-10-CM | POA: Diagnosis not present

## 2018-08-13 DIAGNOSIS — J449 Chronic obstructive pulmonary disease, unspecified: Secondary | ICD-10-CM | POA: Diagnosis not present

## 2018-08-13 DIAGNOSIS — I4891 Unspecified atrial fibrillation: Secondary | ICD-10-CM | POA: Diagnosis not present

## 2018-08-14 DIAGNOSIS — E785 Hyperlipidemia, unspecified: Secondary | ICD-10-CM | POA: Diagnosis not present

## 2018-08-14 DIAGNOSIS — I495 Sick sinus syndrome: Secondary | ICD-10-CM | POA: Diagnosis not present

## 2018-08-14 DIAGNOSIS — J449 Chronic obstructive pulmonary disease, unspecified: Secondary | ICD-10-CM | POA: Diagnosis not present

## 2018-08-14 DIAGNOSIS — N183 Chronic kidney disease, stage 3 (moderate): Secondary | ICD-10-CM | POA: Diagnosis not present

## 2018-08-14 DIAGNOSIS — I4891 Unspecified atrial fibrillation: Secondary | ICD-10-CM | POA: Diagnosis not present

## 2018-08-14 DIAGNOSIS — Z48812 Encounter for surgical aftercare following surgery on the circulatory system: Secondary | ICD-10-CM | POA: Diagnosis not present

## 2018-08-15 DIAGNOSIS — N183 Chronic kidney disease, stage 3 (moderate): Secondary | ICD-10-CM | POA: Diagnosis not present

## 2018-08-15 DIAGNOSIS — Z48812 Encounter for surgical aftercare following surgery on the circulatory system: Secondary | ICD-10-CM | POA: Diagnosis not present

## 2018-08-15 DIAGNOSIS — J449 Chronic obstructive pulmonary disease, unspecified: Secondary | ICD-10-CM | POA: Diagnosis not present

## 2018-08-15 DIAGNOSIS — I4891 Unspecified atrial fibrillation: Secondary | ICD-10-CM | POA: Diagnosis not present

## 2018-08-15 DIAGNOSIS — E785 Hyperlipidemia, unspecified: Secondary | ICD-10-CM | POA: Diagnosis not present

## 2018-08-15 DIAGNOSIS — I495 Sick sinus syndrome: Secondary | ICD-10-CM | POA: Diagnosis not present

## 2018-08-18 ENCOUNTER — Other Ambulatory Visit: Payer: Self-pay

## 2018-08-18 DIAGNOSIS — E785 Hyperlipidemia, unspecified: Secondary | ICD-10-CM | POA: Diagnosis not present

## 2018-08-18 DIAGNOSIS — N183 Chronic kidney disease, stage 3 (moderate): Secondary | ICD-10-CM | POA: Diagnosis not present

## 2018-08-18 DIAGNOSIS — I495 Sick sinus syndrome: Secondary | ICD-10-CM | POA: Diagnosis not present

## 2018-08-18 DIAGNOSIS — Z48812 Encounter for surgical aftercare following surgery on the circulatory system: Secondary | ICD-10-CM | POA: Diagnosis not present

## 2018-08-18 DIAGNOSIS — I4891 Unspecified atrial fibrillation: Secondary | ICD-10-CM | POA: Diagnosis not present

## 2018-08-18 DIAGNOSIS — J449 Chronic obstructive pulmonary disease, unspecified: Secondary | ICD-10-CM | POA: Diagnosis not present

## 2018-08-19 DIAGNOSIS — J449 Chronic obstructive pulmonary disease, unspecified: Secondary | ICD-10-CM | POA: Diagnosis not present

## 2018-08-19 DIAGNOSIS — N183 Chronic kidney disease, stage 3 (moderate): Secondary | ICD-10-CM | POA: Diagnosis not present

## 2018-08-19 DIAGNOSIS — E785 Hyperlipidemia, unspecified: Secondary | ICD-10-CM | POA: Diagnosis not present

## 2018-08-19 DIAGNOSIS — I4891 Unspecified atrial fibrillation: Secondary | ICD-10-CM | POA: Diagnosis not present

## 2018-08-19 DIAGNOSIS — I495 Sick sinus syndrome: Secondary | ICD-10-CM | POA: Diagnosis not present

## 2018-08-19 DIAGNOSIS — Z48812 Encounter for surgical aftercare following surgery on the circulatory system: Secondary | ICD-10-CM | POA: Diagnosis not present

## 2018-08-21 DIAGNOSIS — J449 Chronic obstructive pulmonary disease, unspecified: Secondary | ICD-10-CM | POA: Diagnosis not present

## 2018-08-21 DIAGNOSIS — E559 Vitamin D deficiency, unspecified: Secondary | ICD-10-CM | POA: Diagnosis not present

## 2018-08-21 DIAGNOSIS — N183 Chronic kidney disease, stage 3 (moderate): Secondary | ICD-10-CM | POA: Diagnosis not present

## 2018-08-21 DIAGNOSIS — I4891 Unspecified atrial fibrillation: Secondary | ICD-10-CM | POA: Diagnosis not present

## 2018-08-21 DIAGNOSIS — Z48812 Encounter for surgical aftercare following surgery on the circulatory system: Secondary | ICD-10-CM | POA: Diagnosis not present

## 2018-08-21 DIAGNOSIS — I495 Sick sinus syndrome: Secondary | ICD-10-CM | POA: Diagnosis not present

## 2018-08-21 DIAGNOSIS — E785 Hyperlipidemia, unspecified: Secondary | ICD-10-CM | POA: Diagnosis not present

## 2018-08-25 DIAGNOSIS — J449 Chronic obstructive pulmonary disease, unspecified: Secondary | ICD-10-CM | POA: Diagnosis not present

## 2018-08-25 DIAGNOSIS — N183 Chronic kidney disease, stage 3 (moderate): Secondary | ICD-10-CM | POA: Diagnosis not present

## 2018-08-25 DIAGNOSIS — E785 Hyperlipidemia, unspecified: Secondary | ICD-10-CM | POA: Diagnosis not present

## 2018-08-25 DIAGNOSIS — I4891 Unspecified atrial fibrillation: Secondary | ICD-10-CM | POA: Diagnosis not present

## 2018-08-25 DIAGNOSIS — Z48812 Encounter for surgical aftercare following surgery on the circulatory system: Secondary | ICD-10-CM | POA: Diagnosis not present

## 2018-08-25 DIAGNOSIS — I495 Sick sinus syndrome: Secondary | ICD-10-CM | POA: Diagnosis not present

## 2018-08-27 DIAGNOSIS — E785 Hyperlipidemia, unspecified: Secondary | ICD-10-CM | POA: Diagnosis not present

## 2018-08-27 DIAGNOSIS — Z48812 Encounter for surgical aftercare following surgery on the circulatory system: Secondary | ICD-10-CM | POA: Diagnosis not present

## 2018-08-27 DIAGNOSIS — I495 Sick sinus syndrome: Secondary | ICD-10-CM | POA: Diagnosis not present

## 2018-08-27 DIAGNOSIS — J449 Chronic obstructive pulmonary disease, unspecified: Secondary | ICD-10-CM | POA: Diagnosis not present

## 2018-08-27 DIAGNOSIS — N183 Chronic kidney disease, stage 3 (moderate): Secondary | ICD-10-CM | POA: Diagnosis not present

## 2018-08-27 DIAGNOSIS — I4891 Unspecified atrial fibrillation: Secondary | ICD-10-CM | POA: Diagnosis not present

## 2018-08-28 DIAGNOSIS — M170 Bilateral primary osteoarthritis of knee: Secondary | ICD-10-CM | POA: Diagnosis not present

## 2018-08-29 DIAGNOSIS — N183 Chronic kidney disease, stage 3 (moderate): Secondary | ICD-10-CM | POA: Diagnosis not present

## 2018-08-29 DIAGNOSIS — I495 Sick sinus syndrome: Secondary | ICD-10-CM | POA: Diagnosis not present

## 2018-08-29 DIAGNOSIS — Z48812 Encounter for surgical aftercare following surgery on the circulatory system: Secondary | ICD-10-CM | POA: Diagnosis not present

## 2018-08-29 DIAGNOSIS — E785 Hyperlipidemia, unspecified: Secondary | ICD-10-CM | POA: Diagnosis not present

## 2018-08-29 DIAGNOSIS — J449 Chronic obstructive pulmonary disease, unspecified: Secondary | ICD-10-CM | POA: Diagnosis not present

## 2018-08-29 DIAGNOSIS — I4891 Unspecified atrial fibrillation: Secondary | ICD-10-CM | POA: Diagnosis not present

## 2018-09-01 DIAGNOSIS — J449 Chronic obstructive pulmonary disease, unspecified: Secondary | ICD-10-CM | POA: Diagnosis not present

## 2018-09-01 DIAGNOSIS — E785 Hyperlipidemia, unspecified: Secondary | ICD-10-CM | POA: Diagnosis not present

## 2018-09-01 DIAGNOSIS — I480 Paroxysmal atrial fibrillation: Secondary | ICD-10-CM | POA: Diagnosis not present

## 2018-09-01 DIAGNOSIS — Z87891 Personal history of nicotine dependence: Secondary | ICD-10-CM | POA: Diagnosis not present

## 2018-09-01 DIAGNOSIS — Z950 Presence of cardiac pacemaker: Secondary | ICD-10-CM | POA: Diagnosis not present

## 2018-09-01 DIAGNOSIS — I495 Sick sinus syndrome: Secondary | ICD-10-CM | POA: Diagnosis not present

## 2018-09-01 DIAGNOSIS — I10 Essential (primary) hypertension: Secondary | ICD-10-CM | POA: Diagnosis not present

## 2018-09-01 DIAGNOSIS — N183 Chronic kidney disease, stage 3 (moderate): Secondary | ICD-10-CM | POA: Diagnosis not present

## 2018-09-01 DIAGNOSIS — Z7901 Long term (current) use of anticoagulants: Secondary | ICD-10-CM | POA: Diagnosis not present

## 2018-09-01 DIAGNOSIS — I4891 Unspecified atrial fibrillation: Secondary | ICD-10-CM | POA: Diagnosis not present

## 2018-09-01 DIAGNOSIS — Z48812 Encounter for surgical aftercare following surgery on the circulatory system: Secondary | ICD-10-CM | POA: Diagnosis not present

## 2018-09-03 DIAGNOSIS — N183 Chronic kidney disease, stage 3 (moderate): Secondary | ICD-10-CM | POA: Diagnosis not present

## 2018-09-03 DIAGNOSIS — Z48812 Encounter for surgical aftercare following surgery on the circulatory system: Secondary | ICD-10-CM | POA: Diagnosis not present

## 2018-09-03 DIAGNOSIS — J449 Chronic obstructive pulmonary disease, unspecified: Secondary | ICD-10-CM | POA: Diagnosis not present

## 2018-09-03 DIAGNOSIS — I4891 Unspecified atrial fibrillation: Secondary | ICD-10-CM | POA: Diagnosis not present

## 2018-09-03 DIAGNOSIS — E785 Hyperlipidemia, unspecified: Secondary | ICD-10-CM | POA: Diagnosis not present

## 2018-09-03 DIAGNOSIS — I495 Sick sinus syndrome: Secondary | ICD-10-CM | POA: Diagnosis not present

## 2018-09-08 DIAGNOSIS — M170 Bilateral primary osteoarthritis of knee: Secondary | ICD-10-CM | POA: Diagnosis not present

## 2018-09-08 DIAGNOSIS — Z950 Presence of cardiac pacemaker: Secondary | ICD-10-CM | POA: Diagnosis not present

## 2018-09-08 DIAGNOSIS — Z7901 Long term (current) use of anticoagulants: Secondary | ICD-10-CM | POA: Diagnosis not present

## 2018-09-08 DIAGNOSIS — I4891 Unspecified atrial fibrillation: Secondary | ICD-10-CM | POA: Diagnosis not present

## 2018-09-08 DIAGNOSIS — J449 Chronic obstructive pulmonary disease, unspecified: Secondary | ICD-10-CM | POA: Diagnosis not present

## 2018-09-08 DIAGNOSIS — I495 Sick sinus syndrome: Secondary | ICD-10-CM | POA: Diagnosis not present

## 2018-09-08 DIAGNOSIS — N183 Chronic kidney disease, stage 3 (moderate): Secondary | ICD-10-CM | POA: Diagnosis not present

## 2018-09-10 DIAGNOSIS — I495 Sick sinus syndrome: Secondary | ICD-10-CM | POA: Diagnosis not present

## 2018-09-10 DIAGNOSIS — M170 Bilateral primary osteoarthritis of knee: Secondary | ICD-10-CM | POA: Diagnosis not present

## 2018-09-10 DIAGNOSIS — Z950 Presence of cardiac pacemaker: Secondary | ICD-10-CM | POA: Diagnosis not present

## 2018-09-10 DIAGNOSIS — I4891 Unspecified atrial fibrillation: Secondary | ICD-10-CM | POA: Diagnosis not present

## 2018-09-10 DIAGNOSIS — N183 Chronic kidney disease, stage 3 (moderate): Secondary | ICD-10-CM | POA: Diagnosis not present

## 2018-09-10 DIAGNOSIS — J449 Chronic obstructive pulmonary disease, unspecified: Secondary | ICD-10-CM | POA: Diagnosis not present

## 2018-09-15 DIAGNOSIS — M170 Bilateral primary osteoarthritis of knee: Secondary | ICD-10-CM | POA: Diagnosis not present

## 2018-09-15 DIAGNOSIS — E538 Deficiency of other specified B group vitamins: Secondary | ICD-10-CM | POA: Diagnosis not present

## 2018-09-15 DIAGNOSIS — N183 Chronic kidney disease, stage 3 (moderate): Secondary | ICD-10-CM | POA: Diagnosis not present

## 2018-09-15 DIAGNOSIS — I495 Sick sinus syndrome: Secondary | ICD-10-CM | POA: Diagnosis not present

## 2018-09-15 DIAGNOSIS — J449 Chronic obstructive pulmonary disease, unspecified: Secondary | ICD-10-CM | POA: Diagnosis not present

## 2018-09-15 DIAGNOSIS — Z950 Presence of cardiac pacemaker: Secondary | ICD-10-CM | POA: Diagnosis not present

## 2018-09-15 DIAGNOSIS — I4891 Unspecified atrial fibrillation: Secondary | ICD-10-CM | POA: Diagnosis not present

## 2018-09-17 DIAGNOSIS — I495 Sick sinus syndrome: Secondary | ICD-10-CM | POA: Diagnosis not present

## 2018-09-17 DIAGNOSIS — N183 Chronic kidney disease, stage 3 (moderate): Secondary | ICD-10-CM | POA: Diagnosis not present

## 2018-09-17 DIAGNOSIS — M170 Bilateral primary osteoarthritis of knee: Secondary | ICD-10-CM | POA: Diagnosis not present

## 2018-09-17 DIAGNOSIS — J449 Chronic obstructive pulmonary disease, unspecified: Secondary | ICD-10-CM | POA: Diagnosis not present

## 2018-09-17 DIAGNOSIS — I4891 Unspecified atrial fibrillation: Secondary | ICD-10-CM | POA: Diagnosis not present

## 2018-09-17 DIAGNOSIS — Z950 Presence of cardiac pacemaker: Secondary | ICD-10-CM | POA: Diagnosis not present

## 2018-09-25 DIAGNOSIS — I495 Sick sinus syndrome: Secondary | ICD-10-CM | POA: Diagnosis not present

## 2018-09-25 DIAGNOSIS — N183 Chronic kidney disease, stage 3 (moderate): Secondary | ICD-10-CM | POA: Diagnosis not present

## 2018-09-25 DIAGNOSIS — M170 Bilateral primary osteoarthritis of knee: Secondary | ICD-10-CM | POA: Diagnosis not present

## 2018-09-25 DIAGNOSIS — Z950 Presence of cardiac pacemaker: Secondary | ICD-10-CM | POA: Diagnosis not present

## 2018-09-25 DIAGNOSIS — I4891 Unspecified atrial fibrillation: Secondary | ICD-10-CM | POA: Diagnosis not present

## 2018-09-25 DIAGNOSIS — J449 Chronic obstructive pulmonary disease, unspecified: Secondary | ICD-10-CM | POA: Diagnosis not present

## 2018-10-01 DIAGNOSIS — Z950 Presence of cardiac pacemaker: Secondary | ICD-10-CM | POA: Diagnosis not present

## 2018-10-01 DIAGNOSIS — M170 Bilateral primary osteoarthritis of knee: Secondary | ICD-10-CM | POA: Diagnosis not present

## 2018-10-01 DIAGNOSIS — N183 Chronic kidney disease, stage 3 (moderate): Secondary | ICD-10-CM | POA: Diagnosis not present

## 2018-10-01 DIAGNOSIS — I4891 Unspecified atrial fibrillation: Secondary | ICD-10-CM | POA: Diagnosis not present

## 2018-10-01 DIAGNOSIS — J449 Chronic obstructive pulmonary disease, unspecified: Secondary | ICD-10-CM | POA: Diagnosis not present

## 2018-10-01 DIAGNOSIS — I495 Sick sinus syndrome: Secondary | ICD-10-CM | POA: Diagnosis not present

## 2018-10-08 DIAGNOSIS — J449 Chronic obstructive pulmonary disease, unspecified: Secondary | ICD-10-CM | POA: Diagnosis not present

## 2018-10-08 DIAGNOSIS — N183 Chronic kidney disease, stage 3 (moderate): Secondary | ICD-10-CM | POA: Diagnosis not present

## 2018-10-08 DIAGNOSIS — I4891 Unspecified atrial fibrillation: Secondary | ICD-10-CM | POA: Diagnosis not present

## 2018-10-08 DIAGNOSIS — Z7901 Long term (current) use of anticoagulants: Secondary | ICD-10-CM | POA: Diagnosis not present

## 2018-10-08 DIAGNOSIS — I495 Sick sinus syndrome: Secondary | ICD-10-CM | POA: Diagnosis not present

## 2018-10-08 DIAGNOSIS — Z950 Presence of cardiac pacemaker: Secondary | ICD-10-CM | POA: Diagnosis not present

## 2018-10-08 DIAGNOSIS — M170 Bilateral primary osteoarthritis of knee: Secondary | ICD-10-CM | POA: Diagnosis not present

## 2018-10-15 DIAGNOSIS — Z950 Presence of cardiac pacemaker: Secondary | ICD-10-CM | POA: Diagnosis not present

## 2018-10-15 DIAGNOSIS — J449 Chronic obstructive pulmonary disease, unspecified: Secondary | ICD-10-CM | POA: Diagnosis not present

## 2018-10-15 DIAGNOSIS — I495 Sick sinus syndrome: Secondary | ICD-10-CM | POA: Diagnosis not present

## 2018-10-15 DIAGNOSIS — M170 Bilateral primary osteoarthritis of knee: Secondary | ICD-10-CM | POA: Diagnosis not present

## 2018-10-15 DIAGNOSIS — N183 Chronic kidney disease, stage 3 (moderate): Secondary | ICD-10-CM | POA: Diagnosis not present

## 2018-10-15 DIAGNOSIS — I4891 Unspecified atrial fibrillation: Secondary | ICD-10-CM | POA: Diagnosis not present

## 2018-10-22 DIAGNOSIS — I4891 Unspecified atrial fibrillation: Secondary | ICD-10-CM | POA: Diagnosis not present

## 2018-10-22 DIAGNOSIS — Z950 Presence of cardiac pacemaker: Secondary | ICD-10-CM | POA: Diagnosis not present

## 2018-10-22 DIAGNOSIS — N183 Chronic kidney disease, stage 3 (moderate): Secondary | ICD-10-CM | POA: Diagnosis not present

## 2018-10-22 DIAGNOSIS — I495 Sick sinus syndrome: Secondary | ICD-10-CM | POA: Diagnosis not present

## 2018-10-22 DIAGNOSIS — M170 Bilateral primary osteoarthritis of knee: Secondary | ICD-10-CM | POA: Diagnosis not present

## 2018-10-22 DIAGNOSIS — J449 Chronic obstructive pulmonary disease, unspecified: Secondary | ICD-10-CM | POA: Diagnosis not present

## 2018-10-23 DIAGNOSIS — Z8719 Personal history of other diseases of the digestive system: Secondary | ICD-10-CM | POA: Diagnosis not present

## 2018-10-23 DIAGNOSIS — I4891 Unspecified atrial fibrillation: Secondary | ICD-10-CM | POA: Diagnosis not present

## 2018-10-23 DIAGNOSIS — E785 Hyperlipidemia, unspecified: Secondary | ICD-10-CM | POA: Diagnosis not present

## 2018-10-23 DIAGNOSIS — E538 Deficiency of other specified B group vitamins: Secondary | ICD-10-CM | POA: Diagnosis not present

## 2018-10-23 DIAGNOSIS — Z23 Encounter for immunization: Secondary | ICD-10-CM | POA: Diagnosis not present

## 2018-10-23 DIAGNOSIS — N183 Chronic kidney disease, stage 3 unspecified: Secondary | ICD-10-CM | POA: Diagnosis not present

## 2018-10-23 DIAGNOSIS — I10 Essential (primary) hypertension: Secondary | ICD-10-CM | POA: Diagnosis not present

## 2018-10-30 DIAGNOSIS — K219 Gastro-esophageal reflux disease without esophagitis: Secondary | ICD-10-CM | POA: Diagnosis not present

## 2018-10-30 DIAGNOSIS — K5730 Diverticulosis of large intestine without perforation or abscess without bleeding: Secondary | ICD-10-CM | POA: Diagnosis not present

## 2018-11-11 DIAGNOSIS — Z950 Presence of cardiac pacemaker: Secondary | ICD-10-CM | POA: Diagnosis not present

## 2018-11-24 DIAGNOSIS — E538 Deficiency of other specified B group vitamins: Secondary | ICD-10-CM | POA: Diagnosis not present

## 2018-12-14 DIAGNOSIS — N3000 Acute cystitis without hematuria: Secondary | ICD-10-CM | POA: Diagnosis not present

## 2018-12-14 DIAGNOSIS — Z79899 Other long term (current) drug therapy: Secondary | ICD-10-CM | POA: Diagnosis not present

## 2018-12-14 DIAGNOSIS — I129 Hypertensive chronic kidney disease with stage 1 through stage 4 chronic kidney disease, or unspecified chronic kidney disease: Secondary | ICD-10-CM | POA: Diagnosis not present

## 2018-12-14 DIAGNOSIS — Z7901 Long term (current) use of anticoagulants: Secondary | ICD-10-CM | POA: Diagnosis not present

## 2018-12-14 DIAGNOSIS — Z87891 Personal history of nicotine dependence: Secondary | ICD-10-CM | POA: Diagnosis not present

## 2018-12-14 DIAGNOSIS — B9689 Other specified bacterial agents as the cause of diseases classified elsewhere: Secondary | ICD-10-CM | POA: Diagnosis not present

## 2018-12-14 DIAGNOSIS — N183 Chronic kidney disease, stage 3 unspecified: Secondary | ICD-10-CM | POA: Diagnosis not present

## 2018-12-14 DIAGNOSIS — K645 Perianal venous thrombosis: Secondary | ICD-10-CM | POA: Diagnosis not present

## 2018-12-14 DIAGNOSIS — I4891 Unspecified atrial fibrillation: Secondary | ICD-10-CM | POA: Diagnosis not present

## 2018-12-14 DIAGNOSIS — Z950 Presence of cardiac pacemaker: Secondary | ICD-10-CM | POA: Diagnosis not present

## 2018-12-14 DIAGNOSIS — J449 Chronic obstructive pulmonary disease, unspecified: Secondary | ICD-10-CM | POA: Diagnosis not present

## 2018-12-14 DIAGNOSIS — Z8673 Personal history of transient ischemic attack (TIA), and cerebral infarction without residual deficits: Secondary | ICD-10-CM | POA: Diagnosis not present

## 2018-12-14 DIAGNOSIS — D649 Anemia, unspecified: Secondary | ICD-10-CM | POA: Diagnosis not present

## 2018-12-14 DIAGNOSIS — D6489 Other specified anemias: Secondary | ICD-10-CM | POA: Diagnosis not present

## 2018-12-23 DIAGNOSIS — E538 Deficiency of other specified B group vitamins: Secondary | ICD-10-CM | POA: Diagnosis not present

## 2019-02-13 DIAGNOSIS — Z950 Presence of cardiac pacemaker: Secondary | ICD-10-CM | POA: Diagnosis not present

## 2019-02-23 DIAGNOSIS — D519 Vitamin B12 deficiency anemia, unspecified: Secondary | ICD-10-CM | POA: Diagnosis not present

## 2019-03-06 DIAGNOSIS — Z7901 Long term (current) use of anticoagulants: Secondary | ICD-10-CM | POA: Diagnosis not present

## 2019-03-06 DIAGNOSIS — Z950 Presence of cardiac pacemaker: Secondary | ICD-10-CM | POA: Diagnosis not present

## 2019-03-06 DIAGNOSIS — I10 Essential (primary) hypertension: Secondary | ICD-10-CM | POA: Diagnosis not present

## 2019-03-06 DIAGNOSIS — I480 Paroxysmal atrial fibrillation: Secondary | ICD-10-CM | POA: Diagnosis not present

## 2019-03-06 DIAGNOSIS — I495 Sick sinus syndrome: Secondary | ICD-10-CM | POA: Diagnosis not present

## 2019-03-30 DIAGNOSIS — E538 Deficiency of other specified B group vitamins: Secondary | ICD-10-CM | POA: Diagnosis not present

## 2019-04-27 DIAGNOSIS — F411 Generalized anxiety disorder: Secondary | ICD-10-CM | POA: Diagnosis not present

## 2019-04-27 DIAGNOSIS — I4891 Unspecified atrial fibrillation: Secondary | ICD-10-CM | POA: Diagnosis not present

## 2019-04-27 DIAGNOSIS — D519 Vitamin B12 deficiency anemia, unspecified: Secondary | ICD-10-CM | POA: Diagnosis not present

## 2019-04-27 DIAGNOSIS — Z8719 Personal history of other diseases of the digestive system: Secondary | ICD-10-CM | POA: Diagnosis not present

## 2019-04-27 DIAGNOSIS — D6869 Other thrombophilia: Secondary | ICD-10-CM | POA: Diagnosis not present

## 2019-04-27 DIAGNOSIS — Z23 Encounter for immunization: Secondary | ICD-10-CM | POA: Diagnosis not present

## 2019-05-15 DIAGNOSIS — Z950 Presence of cardiac pacemaker: Secondary | ICD-10-CM | POA: Diagnosis not present

## 2019-06-01 DIAGNOSIS — D519 Vitamin B12 deficiency anemia, unspecified: Secondary | ICD-10-CM | POA: Diagnosis not present

## 2019-06-24 DIAGNOSIS — M170 Bilateral primary osteoarthritis of knee: Secondary | ICD-10-CM | POA: Diagnosis not present

## 2019-08-18 DIAGNOSIS — Z7901 Long term (current) use of anticoagulants: Secondary | ICD-10-CM | POA: Diagnosis not present

## 2019-08-18 DIAGNOSIS — Z950 Presence of cardiac pacemaker: Secondary | ICD-10-CM | POA: Diagnosis not present

## 2019-08-18 DIAGNOSIS — I480 Paroxysmal atrial fibrillation: Secondary | ICD-10-CM | POA: Diagnosis not present

## 2019-08-18 DIAGNOSIS — I495 Sick sinus syndrome: Secondary | ICD-10-CM | POA: Diagnosis not present

## 2019-08-18 DIAGNOSIS — I10 Essential (primary) hypertension: Secondary | ICD-10-CM | POA: Diagnosis not present

## 2019-08-18 DIAGNOSIS — D649 Anemia, unspecified: Secondary | ICD-10-CM | POA: Diagnosis not present

## 2019-08-18 DIAGNOSIS — R609 Edema, unspecified: Secondary | ICD-10-CM | POA: Diagnosis not present

## 2019-08-18 DIAGNOSIS — Z87448 Personal history of other diseases of urinary system: Secondary | ICD-10-CM | POA: Diagnosis not present

## 2019-08-18 DIAGNOSIS — E785 Hyperlipidemia, unspecified: Secondary | ICD-10-CM | POA: Diagnosis not present

## 2019-08-25 DIAGNOSIS — D519 Vitamin B12 deficiency anemia, unspecified: Secondary | ICD-10-CM | POA: Diagnosis not present

## 2019-09-23 DIAGNOSIS — Z7901 Long term (current) use of anticoagulants: Secondary | ICD-10-CM | POA: Diagnosis not present

## 2019-09-23 DIAGNOSIS — D519 Vitamin B12 deficiency anemia, unspecified: Secondary | ICD-10-CM | POA: Diagnosis not present

## 2019-09-23 DIAGNOSIS — I480 Paroxysmal atrial fibrillation: Secondary | ICD-10-CM | POA: Diagnosis not present

## 2019-09-23 DIAGNOSIS — I10 Essential (primary) hypertension: Secondary | ICD-10-CM | POA: Diagnosis not present

## 2019-09-23 DIAGNOSIS — Z950 Presence of cardiac pacemaker: Secondary | ICD-10-CM | POA: Diagnosis not present

## 2019-09-23 DIAGNOSIS — I495 Sick sinus syndrome: Secondary | ICD-10-CM | POA: Diagnosis not present

## 2019-11-03 DIAGNOSIS — Z20822 Contact with and (suspected) exposure to covid-19: Secondary | ICD-10-CM | POA: Diagnosis not present

## 2019-11-12 DIAGNOSIS — E538 Deficiency of other specified B group vitamins: Secondary | ICD-10-CM | POA: Diagnosis not present

## 2019-11-30 DIAGNOSIS — K219 Gastro-esophageal reflux disease without esophagitis: Secondary | ICD-10-CM | POA: Diagnosis not present

## 2019-11-30 DIAGNOSIS — K5730 Diverticulosis of large intestine without perforation or abscess without bleeding: Secondary | ICD-10-CM | POA: Diagnosis not present

## 2019-11-30 DIAGNOSIS — D500 Iron deficiency anemia secondary to blood loss (chronic): Secondary | ICD-10-CM | POA: Diagnosis not present

## 2019-12-14 DIAGNOSIS — E538 Deficiency of other specified B group vitamins: Secondary | ICD-10-CM | POA: Diagnosis not present

## 2019-12-14 DIAGNOSIS — Z23 Encounter for immunization: Secondary | ICD-10-CM | POA: Diagnosis not present

## 2019-12-25 DIAGNOSIS — M170 Bilateral primary osteoarthritis of knee: Secondary | ICD-10-CM | POA: Diagnosis not present

## 2020-01-05 DIAGNOSIS — E538 Deficiency of other specified B group vitamins: Secondary | ICD-10-CM | POA: Diagnosis not present

## 2020-01-05 DIAGNOSIS — I10 Essential (primary) hypertension: Secondary | ICD-10-CM | POA: Diagnosis not present

## 2020-01-05 DIAGNOSIS — E785 Hyperlipidemia, unspecified: Secondary | ICD-10-CM | POA: Diagnosis not present

## 2020-01-05 DIAGNOSIS — Z79899 Other long term (current) drug therapy: Secondary | ICD-10-CM | POA: Diagnosis not present

## 2020-01-05 DIAGNOSIS — I4891 Unspecified atrial fibrillation: Secondary | ICD-10-CM | POA: Diagnosis not present

## 2020-01-05 DIAGNOSIS — D500 Iron deficiency anemia secondary to blood loss (chronic): Secondary | ICD-10-CM | POA: Diagnosis not present

## 2020-01-05 DIAGNOSIS — N1832 Chronic kidney disease, stage 3b: Secondary | ICD-10-CM | POA: Diagnosis not present

## 2020-01-05 DIAGNOSIS — Z23 Encounter for immunization: Secondary | ICD-10-CM | POA: Diagnosis not present

## 2020-02-08 DIAGNOSIS — E538 Deficiency of other specified B group vitamins: Secondary | ICD-10-CM | POA: Diagnosis not present

## 2020-02-16 DIAGNOSIS — Z950 Presence of cardiac pacemaker: Secondary | ICD-10-CM | POA: Diagnosis not present

## 2020-03-22 DIAGNOSIS — E538 Deficiency of other specified B group vitamins: Secondary | ICD-10-CM | POA: Diagnosis not present

## 2020-03-30 DIAGNOSIS — I495 Sick sinus syndrome: Secondary | ICD-10-CM | POA: Diagnosis not present

## 2020-03-30 DIAGNOSIS — I10 Essential (primary) hypertension: Secondary | ICD-10-CM | POA: Diagnosis not present

## 2020-03-30 DIAGNOSIS — Z950 Presence of cardiac pacemaker: Secondary | ICD-10-CM | POA: Diagnosis not present

## 2020-03-30 DIAGNOSIS — Z7901 Long term (current) use of anticoagulants: Secondary | ICD-10-CM | POA: Diagnosis not present

## 2020-03-30 DIAGNOSIS — I480 Paroxysmal atrial fibrillation: Secondary | ICD-10-CM | POA: Diagnosis not present

## 2020-04-04 DIAGNOSIS — E785 Hyperlipidemia, unspecified: Secondary | ICD-10-CM | POA: Diagnosis not present

## 2020-04-04 DIAGNOSIS — I10 Essential (primary) hypertension: Secondary | ICD-10-CM | POA: Diagnosis not present

## 2020-04-04 DIAGNOSIS — R7302 Impaired glucose tolerance (oral): Secondary | ICD-10-CM | POA: Diagnosis not present

## 2020-04-04 DIAGNOSIS — N1832 Chronic kidney disease, stage 3b: Secondary | ICD-10-CM | POA: Diagnosis not present

## 2020-04-04 DIAGNOSIS — E559 Vitamin D deficiency, unspecified: Secondary | ICD-10-CM | POA: Diagnosis not present

## 2020-04-04 DIAGNOSIS — E538 Deficiency of other specified B group vitamins: Secondary | ICD-10-CM | POA: Diagnosis not present

## 2020-04-04 DIAGNOSIS — I4891 Unspecified atrial fibrillation: Secondary | ICD-10-CM | POA: Diagnosis not present

## 2020-04-04 DIAGNOSIS — Z79899 Other long term (current) drug therapy: Secondary | ICD-10-CM | POA: Diagnosis not present

## 2020-04-22 DIAGNOSIS — E559 Vitamin D deficiency, unspecified: Secondary | ICD-10-CM | POA: Diagnosis not present

## 2020-05-03 DIAGNOSIS — E611 Iron deficiency: Secondary | ICD-10-CM | POA: Diagnosis not present

## 2020-05-10 DIAGNOSIS — E611 Iron deficiency: Secondary | ICD-10-CM | POA: Diagnosis not present

## 2020-05-17 DIAGNOSIS — Z950 Presence of cardiac pacemaker: Secondary | ICD-10-CM | POA: Diagnosis not present

## 2020-05-20 DIAGNOSIS — E538 Deficiency of other specified B group vitamins: Secondary | ICD-10-CM | POA: Diagnosis not present

## 2020-05-26 DIAGNOSIS — H26492 Other secondary cataract, left eye: Secondary | ICD-10-CM | POA: Diagnosis not present

## 2020-05-26 DIAGNOSIS — H353131 Nonexudative age-related macular degeneration, bilateral, early dry stage: Secondary | ICD-10-CM | POA: Diagnosis not present

## 2020-06-20 DIAGNOSIS — E538 Deficiency of other specified B group vitamins: Secondary | ICD-10-CM | POA: Diagnosis not present

## 2020-07-04 DIAGNOSIS — M170 Bilateral primary osteoarthritis of knee: Secondary | ICD-10-CM | POA: Diagnosis not present

## 2020-07-05 DIAGNOSIS — Z9181 History of falling: Secondary | ICD-10-CM | POA: Diagnosis not present

## 2020-07-05 DIAGNOSIS — I10 Essential (primary) hypertension: Secondary | ICD-10-CM | POA: Diagnosis not present

## 2020-07-05 DIAGNOSIS — D6869 Other thrombophilia: Secondary | ICD-10-CM | POA: Diagnosis not present

## 2020-07-05 DIAGNOSIS — I4891 Unspecified atrial fibrillation: Secondary | ICD-10-CM | POA: Diagnosis not present

## 2020-07-05 DIAGNOSIS — M1991 Primary osteoarthritis, unspecified site: Secondary | ICD-10-CM | POA: Diagnosis not present

## 2020-07-05 DIAGNOSIS — M4800 Spinal stenosis, site unspecified: Secondary | ICD-10-CM | POA: Diagnosis not present

## 2020-07-05 DIAGNOSIS — N1832 Chronic kidney disease, stage 3b: Secondary | ICD-10-CM | POA: Diagnosis not present

## 2020-07-15 DIAGNOSIS — H26492 Other secondary cataract, left eye: Secondary | ICD-10-CM | POA: Diagnosis not present

## 2020-07-20 DIAGNOSIS — E538 Deficiency of other specified B group vitamins: Secondary | ICD-10-CM | POA: Diagnosis not present

## 2020-07-28 DIAGNOSIS — M4800 Spinal stenosis, site unspecified: Secondary | ICD-10-CM | POA: Diagnosis not present

## 2020-07-28 DIAGNOSIS — M1991 Primary osteoarthritis, unspecified site: Secondary | ICD-10-CM | POA: Diagnosis not present

## 2020-08-01 DIAGNOSIS — E785 Hyperlipidemia, unspecified: Secondary | ICD-10-CM | POA: Diagnosis not present

## 2020-08-01 DIAGNOSIS — E611 Iron deficiency: Secondary | ICD-10-CM | POA: Diagnosis not present

## 2020-08-01 DIAGNOSIS — D500 Iron deficiency anemia secondary to blood loss (chronic): Secondary | ICD-10-CM | POA: Diagnosis not present

## 2020-08-01 DIAGNOSIS — Z79899 Other long term (current) drug therapy: Secondary | ICD-10-CM | POA: Diagnosis not present

## 2020-08-01 DIAGNOSIS — N1832 Chronic kidney disease, stage 3b: Secondary | ICD-10-CM | POA: Diagnosis not present

## 2020-08-01 DIAGNOSIS — R7301 Impaired fasting glucose: Secondary | ICD-10-CM | POA: Diagnosis not present

## 2020-08-01 DIAGNOSIS — Z0000 Encounter for general adult medical examination without abnormal findings: Secondary | ICD-10-CM | POA: Diagnosis not present

## 2020-08-01 DIAGNOSIS — I70313 Atherosclerosis of unspecified type of bypass graft(s) of the extremities with intermittent claudication, bilateral legs: Secondary | ICD-10-CM | POA: Diagnosis not present

## 2020-08-15 DIAGNOSIS — Z950 Presence of cardiac pacemaker: Secondary | ICD-10-CM | POA: Diagnosis not present

## 2020-08-23 DIAGNOSIS — I10 Essential (primary) hypertension: Secondary | ICD-10-CM | POA: Diagnosis not present

## 2020-08-23 DIAGNOSIS — Z7901 Long term (current) use of anticoagulants: Secondary | ICD-10-CM | POA: Diagnosis not present

## 2020-08-23 DIAGNOSIS — I480 Paroxysmal atrial fibrillation: Secondary | ICD-10-CM | POA: Diagnosis not present

## 2020-08-23 DIAGNOSIS — Z950 Presence of cardiac pacemaker: Secondary | ICD-10-CM | POA: Diagnosis not present

## 2020-08-23 DIAGNOSIS — E538 Deficiency of other specified B group vitamins: Secondary | ICD-10-CM | POA: Diagnosis not present

## 2020-08-28 DIAGNOSIS — M4800 Spinal stenosis, site unspecified: Secondary | ICD-10-CM | POA: Diagnosis not present

## 2020-08-28 DIAGNOSIS — M1991 Primary osteoarthritis, unspecified site: Secondary | ICD-10-CM | POA: Diagnosis not present

## 2020-09-23 DIAGNOSIS — D510 Vitamin B12 deficiency anemia due to intrinsic factor deficiency: Secondary | ICD-10-CM | POA: Diagnosis not present

## 2020-09-28 DIAGNOSIS — M1991 Primary osteoarthritis, unspecified site: Secondary | ICD-10-CM | POA: Diagnosis not present

## 2020-09-28 DIAGNOSIS — M4800 Spinal stenosis, site unspecified: Secondary | ICD-10-CM | POA: Diagnosis not present

## 2020-10-17 DIAGNOSIS — I495 Sick sinus syndrome: Secondary | ICD-10-CM | POA: Diagnosis not present

## 2020-10-17 DIAGNOSIS — I480 Paroxysmal atrial fibrillation: Secondary | ICD-10-CM | POA: Diagnosis not present

## 2020-10-17 DIAGNOSIS — E785 Hyperlipidemia, unspecified: Secondary | ICD-10-CM | POA: Diagnosis not present

## 2020-10-17 DIAGNOSIS — E538 Deficiency of other specified B group vitamins: Secondary | ICD-10-CM | POA: Diagnosis not present

## 2020-10-17 DIAGNOSIS — Z23 Encounter for immunization: Secondary | ICD-10-CM | POA: Diagnosis not present

## 2020-10-17 DIAGNOSIS — I10 Essential (primary) hypertension: Secondary | ICD-10-CM | POA: Diagnosis not present

## 2020-10-17 DIAGNOSIS — Z7901 Long term (current) use of anticoagulants: Secondary | ICD-10-CM | POA: Diagnosis not present

## 2020-10-17 DIAGNOSIS — Z950 Presence of cardiac pacemaker: Secondary | ICD-10-CM | POA: Diagnosis not present

## 2020-10-28 DIAGNOSIS — M4800 Spinal stenosis, site unspecified: Secondary | ICD-10-CM | POA: Diagnosis not present

## 2020-10-28 DIAGNOSIS — M1991 Primary osteoarthritis, unspecified site: Secondary | ICD-10-CM | POA: Diagnosis not present

## 2020-11-28 DIAGNOSIS — M1991 Primary osteoarthritis, unspecified site: Secondary | ICD-10-CM | POA: Diagnosis not present

## 2020-11-28 DIAGNOSIS — M4800 Spinal stenosis, site unspecified: Secondary | ICD-10-CM | POA: Diagnosis not present

## 2020-11-30 DIAGNOSIS — E538 Deficiency of other specified B group vitamins: Secondary | ICD-10-CM | POA: Diagnosis not present

## 2020-12-28 DIAGNOSIS — M4800 Spinal stenosis, site unspecified: Secondary | ICD-10-CM | POA: Diagnosis not present

## 2020-12-28 DIAGNOSIS — M1991 Primary osteoarthritis, unspecified site: Secondary | ICD-10-CM | POA: Diagnosis not present

## 2021-01-03 DIAGNOSIS — E538 Deficiency of other specified B group vitamins: Secondary | ICD-10-CM | POA: Diagnosis not present

## 2021-01-28 DIAGNOSIS — M4800 Spinal stenosis, site unspecified: Secondary | ICD-10-CM | POA: Diagnosis not present

## 2021-01-28 DIAGNOSIS — M1991 Primary osteoarthritis, unspecified site: Secondary | ICD-10-CM | POA: Diagnosis not present

## 2021-02-02 DIAGNOSIS — E538 Deficiency of other specified B group vitamins: Secondary | ICD-10-CM | POA: Diagnosis not present

## 2021-02-28 DIAGNOSIS — M1991 Primary osteoarthritis, unspecified site: Secondary | ICD-10-CM | POA: Diagnosis not present

## 2021-02-28 DIAGNOSIS — M4800 Spinal stenosis, site unspecified: Secondary | ICD-10-CM | POA: Diagnosis not present

## 2021-03-03 DIAGNOSIS — Z79899 Other long term (current) drug therapy: Secondary | ICD-10-CM | POA: Diagnosis not present

## 2021-03-03 DIAGNOSIS — I10 Essential (primary) hypertension: Secondary | ICD-10-CM | POA: Diagnosis not present

## 2021-03-03 DIAGNOSIS — N1832 Chronic kidney disease, stage 3b: Secondary | ICD-10-CM | POA: Diagnosis not present

## 2021-03-03 DIAGNOSIS — E538 Deficiency of other specified B group vitamins: Secondary | ICD-10-CM | POA: Diagnosis not present

## 2021-03-08 DIAGNOSIS — D500 Iron deficiency anemia secondary to blood loss (chronic): Secondary | ICD-10-CM | POA: Diagnosis not present

## 2021-03-08 DIAGNOSIS — Z4509 Encounter for adjustment and management of other cardiac device: Secondary | ICD-10-CM | POA: Diagnosis not present

## 2021-03-23 DIAGNOSIS — N1832 Chronic kidney disease, stage 3b: Secondary | ICD-10-CM | POA: Diagnosis not present

## 2021-03-23 DIAGNOSIS — M1991 Primary osteoarthritis, unspecified site: Secondary | ICD-10-CM | POA: Diagnosis not present

## 2021-03-23 DIAGNOSIS — Z8719 Personal history of other diseases of the digestive system: Secondary | ICD-10-CM | POA: Diagnosis not present

## 2021-03-23 DIAGNOSIS — I4891 Unspecified atrial fibrillation: Secondary | ICD-10-CM | POA: Diagnosis not present

## 2021-03-23 DIAGNOSIS — M7989 Other specified soft tissue disorders: Secondary | ICD-10-CM | POA: Diagnosis not present

## 2021-03-23 DIAGNOSIS — M79604 Pain in right leg: Secondary | ICD-10-CM | POA: Diagnosis not present

## 2021-03-23 DIAGNOSIS — D6869 Other thrombophilia: Secondary | ICD-10-CM | POA: Diagnosis not present

## 2021-03-23 DIAGNOSIS — I70213 Atherosclerosis of native arteries of extremities with intermittent claudication, bilateral legs: Secondary | ICD-10-CM | POA: Diagnosis not present

## 2021-03-24 DIAGNOSIS — M79604 Pain in right leg: Secondary | ICD-10-CM | POA: Diagnosis not present

## 2021-03-24 DIAGNOSIS — M7989 Other specified soft tissue disorders: Secondary | ICD-10-CM | POA: Diagnosis not present

## 2021-03-28 DIAGNOSIS — M4800 Spinal stenosis, site unspecified: Secondary | ICD-10-CM | POA: Diagnosis not present

## 2021-03-28 DIAGNOSIS — M1991 Primary osteoarthritis, unspecified site: Secondary | ICD-10-CM | POA: Diagnosis not present

## 2021-04-10 DIAGNOSIS — D6869 Other thrombophilia: Secondary | ICD-10-CM | POA: Diagnosis not present

## 2021-04-10 DIAGNOSIS — R748 Abnormal levels of other serum enzymes: Secondary | ICD-10-CM | POA: Diagnosis not present

## 2021-04-17 DIAGNOSIS — E785 Hyperlipidemia, unspecified: Secondary | ICD-10-CM | POA: Diagnosis not present

## 2021-04-17 DIAGNOSIS — I480 Paroxysmal atrial fibrillation: Secondary | ICD-10-CM | POA: Diagnosis not present

## 2021-04-17 DIAGNOSIS — I358 Other nonrheumatic aortic valve disorders: Secondary | ICD-10-CM | POA: Insufficient documentation

## 2021-04-17 DIAGNOSIS — I10 Essential (primary) hypertension: Secondary | ICD-10-CM | POA: Diagnosis not present

## 2021-04-17 DIAGNOSIS — Z950 Presence of cardiac pacemaker: Secondary | ICD-10-CM | POA: Diagnosis not present

## 2021-04-17 DIAGNOSIS — Z7901 Long term (current) use of anticoagulants: Secondary | ICD-10-CM | POA: Diagnosis not present

## 2021-04-17 DIAGNOSIS — I495 Sick sinus syndrome: Secondary | ICD-10-CM | POA: Diagnosis not present

## 2021-04-24 DIAGNOSIS — D500 Iron deficiency anemia secondary to blood loss (chronic): Secondary | ICD-10-CM | POA: Diagnosis not present

## 2021-04-24 DIAGNOSIS — I4891 Unspecified atrial fibrillation: Secondary | ICD-10-CM | POA: Diagnosis not present

## 2021-04-24 DIAGNOSIS — J42 Unspecified chronic bronchitis: Secondary | ICD-10-CM | POA: Diagnosis not present

## 2021-04-24 DIAGNOSIS — D6869 Other thrombophilia: Secondary | ICD-10-CM | POA: Diagnosis not present

## 2021-04-24 DIAGNOSIS — I70213 Atherosclerosis of native arteries of extremities with intermittent claudication, bilateral legs: Secondary | ICD-10-CM | POA: Diagnosis not present

## 2021-04-28 DIAGNOSIS — M1991 Primary osteoarthritis, unspecified site: Secondary | ICD-10-CM | POA: Diagnosis not present

## 2021-04-28 DIAGNOSIS — M4800 Spinal stenosis, site unspecified: Secondary | ICD-10-CM | POA: Diagnosis not present

## 2021-05-01 DIAGNOSIS — I358 Other nonrheumatic aortic valve disorders: Secondary | ICD-10-CM | POA: Diagnosis not present

## 2021-05-04 ENCOUNTER — Ambulatory Visit: Payer: Medicare Other | Admitting: Podiatry

## 2021-05-04 ENCOUNTER — Encounter: Payer: Self-pay | Admitting: Podiatry

## 2021-05-04 DIAGNOSIS — I70203 Unspecified atherosclerosis of native arteries of extremities, bilateral legs: Secondary | ICD-10-CM

## 2021-05-04 DIAGNOSIS — R600 Localized edema: Secondary | ICD-10-CM | POA: Diagnosis not present

## 2021-05-04 DIAGNOSIS — L84 Corns and callosities: Secondary | ICD-10-CM

## 2021-05-04 DIAGNOSIS — B351 Tinea unguium: Secondary | ICD-10-CM

## 2021-05-04 DIAGNOSIS — M79675 Pain in left toe(s): Secondary | ICD-10-CM | POA: Diagnosis not present

## 2021-05-04 DIAGNOSIS — M79674 Pain in right toe(s): Secondary | ICD-10-CM

## 2021-05-04 NOTE — Patient Instructions (Addendum)
Wear heel protector on left lower extremity whenever in bed. If heel becomes painful, red, has odor, swollen or drains any fluid, please call office immediately to see a physician. If you have any fever, chills, night sweats, nausea or vomiting, report to nearest Emergency room. ? ?Pressure Injury ? ?A pressure injury is damage to the skin and underlying tissue that results from pressure being applied to an area of the body. It often affects people who must spend a long time in a bed or chair because of a medical condition. ?Pressure injuries usually occur: ?Over bony parts of the body, such as the tailbone, shoulders, elbows, hips, heels, spine, ankles, and back of the head. ?Under medical devices that make contact with the body, such as respiratory equipment, stockings, tubes, and splints. ?Pressure injuries start as reddened areas on the skin and can lead to pain and an open wound. ?What are the causes? ?This condition is caused by frequent or constant pressure to an area of the body. Decreased blood flow to the skin can eventually cause the skin tissue to die and break down, causing a wound. ?What increases the risk? ?You are more likely to develop this condition if you: ?Are in the hospital or an extended care facility. ?Are bedridden or in a wheelchair. ?Have an injury or disease that keeps you from: ?Moving normally. ?Feeling pain or pressure. ?Have a condition that: ?Makes you sleepy or less alert. ?Causes poor blood flow. ?Need to wear a medical device. ?Have poor control of your bladder or bowel functions (incontinence). ?Have poor nutrition (malnutrition). ?If you are at risk for pressure injuries, your health care provider may recommend certain types of mattresses, mattress covers, pillows, cushions, or boots to help prevent them. These may include products filled with air, foam, gel, or sand. ?What are the signs or symptoms? ?Symptoms of this condition depend on the severity of the injury. Symptoms may  include: ?Red or dark areas of the skin. ?Pain, warmth, or a change of skin texture. ?Blisters. ?An open wound. ?How is this diagnosed? ?This condition is diagnosed with a medical history and physical exam. You may also have tests, such as: ?Blood tests. ?Imaging tests. ?Blood flow tests. ?Your pressure injury will be staged based on its severity. Staging is based on: ?The depth of the tissue injury, including whether there is exposure of muscle, bone, or tendon. ?The cause of the pressure injury. ?How is this treated? ?This condition may be treated by: ?Relieving or redistributing pressure on your skin. This includes: ?Frequently changing your position. ?Avoiding positions that caused the wound or that can make the wound worse. ?Using specific bed mattresses, chair cushions, or protective boots. ?Moving medical devices from an area of pressure, or placing padding between the skin and the device. ?Using foams, creams, or powders to prevent rubbing (friction) on the skin. ?Keeping your skin clean and dry. This may include using a skin cleanser or skin barrier as told by your health care provider. ?Cleaning your injury and removing any dead tissue from the wound (debridement). ?Placing a bandage (dressing) over your injury. ?Using medicines for pain or to prevent or treat infection. ?Surgery may be needed if other treatments are not working or if your injury is very deep. ?Follow these instructions at home: ?Wound care ?Follow instructions from your health care provider about how to take care of your wound. Make sure you: ?Wash your hands with soap and water before and after you change your bandage (dressing). If soap and  water are not available, use hand sanitizer. ?Change your dressing as told by your health care provider.  ?Check your wound every day for signs of infection. Have a caregiver do this for you if you are not able. Check for: ?Redness, swelling, or increased pain. ?More fluid or blood. ?Warmth. ?Pus or  a bad smell. ?Skin care ?Keep your skin clean and dry. Gently pat your skin dry. ?Do not rub or massage your skin. ?You or a caregiver should check your skin every day for any changes in color or any new blisters or sores (ulcers). ?Medicines ?Take over-the-counter and prescription medicines only as told by your health care provider. ?If you were prescribed an antibiotic medicine, take or apply it as told by your health care provider. Do not stop using the antibiotic even if your condition improves. ?Reducing and redistributing pressure ?Do not lie or sit in one position for a long time. Move or change position every 1-2 hours, or as told by your health care provider. ?Use pillows or cushions to reduce pressure. Ask your health care provider to recommend cushions or pads for you. ?General instructions ? ?Eat a healthy diet that includes lots of protein. ?Drink enough fluid to keep your urine pale yellow. ?Be as active as you can every day. Ask your health care provider to suggest safe exercises or activities. ?Do not abuse drugs or alcohol. ?Do not use any products that contain nicotine or tobacco, such as cigarettes, e-cigarettes, and chewing tobacco. If you need help quitting, ask your health care provider. ?Keep all follow-up visits as told by your health care provider. This is important. ?Contact a health care provider if: ?You have: ?A fever or chills. ?Pain that is not helped by medicine. ?Any changes in skin color. ?New blisters or sores. ?Pus or a bad smell coming from your wound. ?Redness, swelling, or pain around your wound. ?More fluid or blood coming from your wound. ?Your wound does not improve after 1-2 weeks of treatment. ?Summary ?A pressure injury is damage to the skin and underlying tissue that results from pressure being applied to an area of the body. ?Do not lie or sit in one position for a long time. Your health care provider may advise you to move or change position every 1-2 hours. ?Follow  instructions from your health care provider about how to take care of your wound. ?Keep all follow-up visits as told by your health care provider. This is important. ?This information is not intended to replace advice given to you by your health care provider. Make sure you discuss any questions you have with your health care provider. ?Document Revised: 10/27/2020 Document Reviewed: 10/27/2020 ?Elsevier Patient Education ? [place]. ? ? ?Onychomycosis/Fungal Toenails ? ?WHAT IS IT? An infection that lies within the keratin of your nail plate that is caused by a fungus. ? ?WHY ME? Fungal infections affect all ages, sexes, races, and creeds.  There may be many factors that predispose you to a fungal infection such as age, coexisting medical conditions such as diabetes, or an autoimmune disease; stress, medications, fatigue, genetics, etc.  Bottom line: fungus thrives in a warm, moist environment and your shoes offer such a location. ? ?IS IT CONTAGIOUS? Theoretically, yes.  You do not want to share shoes, nail clippers or files with someone who has fungal toenails.  Walking around barefoot in the same room or sleeping in the same bed is unlikely to transfer the organism.  It is important to realize, however,  that fungus can spread easily from one nail to the next on the same foot. ? ?HOW DO WE TREAT THIS?  There are several ways to treat this condition.  Treatment may depend on many factors such as age, medications, pregnancy, liver and kidney conditions, etc.  It is best to ask your doctor which options are available to you. ? ?No treatment.   Unlike many other medical concerns, you can live with this condition.  However for many people this can be a painful condition and may lead to ingrown toenails or a bacterial infection.  It is recommended that you keep the nails cut short to help reduce the amount of fungal nail. ?Topical treatment.  These range from herbal remedies to prescription strength nail  lacquers.  About 40-50% effective, topicals require twice daily application for approximately 9 to 12 months or until an entirely new nail has grown out.  The most effective topicals are medical grade medica

## 2021-05-14 NOTE — Progress Notes (Signed)
Subjective: ?Kelly Terrell presents today referred by Street, Stephanie Coup, MD for complaint of with chief concern of elongated, thickened, painful, discolored toenails for months. Patient has tried nothing to treat condition and has sought professional assistance. Patient also has c/o painful heel posterior aspect of LLE. Pain has been present for about one month. She denies any attempt at treatment for condition. Pain is aggravated by pressure such as lying in bed..  ? ?Patient Active Problem List  ? Diagnosis Date Noted  ? Aortic valve sclerosis 04/17/2021  ? Primary osteoarthritis of both knees 08/28/2018  ? Essential hypertension 06/12/2018  ? History of renal insufficiency 06/12/2018  ? Anemia 08/21/2017  ? Current use of long term anticoagulation 07/24/2016  ? Swelling 07/24/2016  ? SSS (sick sinus syndrome) (HCC) 01/26/2016  ? Dyslipidemia 09/22/2014  ? Pacemaker 09/22/2014  ? Paroxysmal atrial fibrillation (HCC) 09/22/2014  ?  ? ?History reviewed. No pertinent surgical history.  ? ?Current Outpatient Medications on File Prior to Visit  ?Medication Sig Dispense Refill  ? albuterol (VENTOLIN HFA) 108 (90 Base) MCG/ACT inhaler Inhale into the lungs.    ? amLODipine (NORVASC) 10 MG tablet Take 1 tablet by mouth daily.    ? apixaban (ELIQUIS) 2.5 MG TABS tablet TAKE 1 TABLET(2.5 MG) BY MOUTH TWICE DAILY    ? atorvastatin (LIPITOR) 10 MG tablet TAKE 1 TABLET(10 MG) BY MOUTH DAILY    ? Cholecalciferol 1.25 MG (50000 UT) capsule TAKE 1 CAPSULE BY MOUTH 1 TIME WEEKLY    ? Fluticasone-Umeclidin-Vilant (TRELEGY ELLIPTA IN) Inhale into the lungs.    ? furosemide (LASIX) 20 MG tablet Take by mouth.    ? HYDROcodone-acetaminophen (NORCO/VICODIN) 5-325 MG tablet Take by mouth.    ? Hylan 48 MG/6ML SOSY Inject into the articular space.    ? mirtazapine (REMERON) 30 MG tablet Take by mouth.    ? pantoprazole (PROTONIX) 40 MG tablet Take 1 tablet by mouth daily.    ? sotalol (BETAPACE) 120 MG tablet TAKE 1 TABLET(120 MG) BY  MOUTH TWICE DAILY    ? ergocalciferol (VITAMIN D2) 1.25 MG (50000 UT) capsule Take 1 capsule by mouth once a week.    ? ?No current facility-administered medications on file prior to visit.  ?  ? ?Not on File  ? ?Social History  ? ?Occupational History  ? Not on file  ?Tobacco Use  ? Smoking status: Never  ?  Passive exposure: Never  ? Smokeless tobacco: Never  ?Substance and Sexual Activity  ? Alcohol use: Not on file  ? Drug use: Not on file  ? Sexual activity: Not on file  ?  ? ?History reviewed. No pertinent family history.  ? ?Immunization History  ?Administered Date(s) Administered  ? Janssen (J&J) SARS-COV-2 Vaccination 04/27/2019  ?  ? ?Objective: ?Kelly Terrell is a pleasant 88 y.o. female frail, in NAD. AAO x 3. ? ?There were no vitals filed for this visit. ? ?Vascular Examination:  ?CFT <3 seconds b/l LE. Palpable DP pulse(s) b/l LE. Faintly palpable PT pulse(s) b/l LE. Pedal hair absent. No pain with calf compression b/l. Lower extremity skin temperature gradient within normal limits. +1 pitting edema noted BLE. No cyanosis or clubbing noted b/l LE. ? ?Dermatological Examination: ?Pedal skin thin, shiny and atrophic b/l LE. No open wounds b/l LE. No interdigital macerations noted b/l LE. Toenails 1-5 b/l elongated, discolored, dystrophic, thickened, crumbly with subungual debris and tenderness to dorsal palpation. Pressure area noted posterior aspect of left heel with raised HKT. No  surrounding erythema, no edema, no drainage, no fluctuance. ? ?Musculoskeletal: ?Noted disuse atrophy bilaterally. No pain, crepitus or joint limitation noted with ROM bilateral LE. HAV with bunion deformity noted b/l LE. ? ?Neurological: ?Protective sensation intact 5/5 intact bilaterally with 10g monofilament b/l. Vibratory sensation intact b/l. ? ?Assessment: ?1. Pain due to onychomycosis of toenails of both feet   ?2. Pre-ulcerative corn or callous   ?3. Bilateral lower extremity edema   ?4. Atherosclerosis of artery of  both lower extremities (HCC)   ?  ?Plan: ?-Patient was evaluated and treated. All patient's and/or POA's questions/concerns answered on today's visit. ?-Mycotic toenails 1-5 bilaterally were debrided in length and girth with sterile nail nippers and dremel without incident. ?-Preulcerative lesion pared posterior aspect of heel left lower extremity. Total number pared=1. ?-Dispensed heel protector for left heel. Apply to left lower extremity every morning. Remove every evening. Do not attempt to walk with heel protector on as it poses a fall risk. ?-Patient/POA to call should there be question/concern in the interim. ? ?Return in about 3 months (around 08/03/2021). ? ?Freddie Breech, DPM ?

## 2021-05-28 DIAGNOSIS — M1991 Primary osteoarthritis, unspecified site: Secondary | ICD-10-CM | POA: Diagnosis not present

## 2021-05-28 DIAGNOSIS — M4800 Spinal stenosis, site unspecified: Secondary | ICD-10-CM | POA: Diagnosis not present

## 2021-05-28 DIAGNOSIS — Z4509 Encounter for adjustment and management of other cardiac device: Secondary | ICD-10-CM | POA: Diagnosis not present

## 2021-07-27 ENCOUNTER — Ambulatory Visit: Payer: Medicare Other | Admitting: Podiatry

## 2021-07-27 ENCOUNTER — Encounter: Payer: Self-pay | Admitting: Podiatry

## 2021-07-27 DIAGNOSIS — M79605 Pain in left leg: Secondary | ICD-10-CM

## 2021-07-27 DIAGNOSIS — M79675 Pain in left toe(s): Secondary | ICD-10-CM | POA: Diagnosis not present

## 2021-07-27 DIAGNOSIS — M79674 Pain in right toe(s): Secondary | ICD-10-CM | POA: Diagnosis not present

## 2021-07-27 DIAGNOSIS — I70203 Unspecified atherosclerosis of native arteries of extremities, bilateral legs: Secondary | ICD-10-CM

## 2021-07-27 DIAGNOSIS — B351 Tinea unguium: Secondary | ICD-10-CM | POA: Diagnosis not present

## 2021-07-31 NOTE — Progress Notes (Signed)
  Subjective:  Patient ID: Kelly Terrell, female    DOB: 1929-04-14,  MRN: [id number]  Kelly Terrell presents to clinic today for at risk foot care. Patient has history of atherosclerosis, painful elongated mycotic toenails 1-5 bilaterally which are tender when wearing enclosed shoe gear. Pain is relieved with periodic professional debridement., and preulcerative lesion(s) left heel. Pain prevent(s) comfortable ambulation. Aggravating factor is weightbearing with and without shoegear.  Patient states her left heel is still painful at times, but says it is better than last visit. Patient is accompanied by staff member who states they have been applying Nervive Roll On to her feet before bedtime and her symptoms of heel pain are managed well.  New problem(s): None.   PCP is Street, Stephanie Coup, MD , and last visit was  March 03, 2021  Not on File  Review of Systems: Negative except as noted in the HPI.  Objective: No changes noted in today's physical examination. Kelly Terrell is a pleasant 88 y.o. female frail, in NAD. AAO x 3.  There were no vitals filed for this visit.  Vascular Examination:  CFT <3 seconds b/l LE. Palpable DP pulse(s) b/l LE. Faintly palpable PT pulse(s) b/l LE. Pedal hair absent. No pain with calf compression b/l. Lower extremity skin temperature gradient within normal limits. +1 pitting edema noted BLE. No cyanosis or clubbing noted b/l LE.  Dermatological Examination: Pedal skin thin, shiny and atrophic b/l LE. No open wounds b/l LE. No interdigital macerations noted b/l LE. Toenails 1-5 b/l elongated, discolored, dystrophic, thickened, crumbly with subungual debris and tenderness to dorsal palpation. No hyperkeratosis noted posterior heel LLE today. No pressure injuries noted today.  Musculoskeletal: Noted disuse atrophy bilaterally. No pain, crepitus or joint limitation noted with ROM bilateral LE. HAV with bunion deformity noted b/l LE.  Neurological: Protective  sensation intact 5/5 intact bilaterally with 10g monofilament b/l. Vibratory sensation intact b/l.  Assessment/Plan: 1. Pain due to onychomycosis of toenails of both feet   2. Atherosclerosis of artery of both lower extremities (HCC)   3. Pain of left lower extremity     -Patient was evaluated and treated. All patient's and/or POA's questions/concerns answered on today's visit. -Continue pressure precautions with heel protectors when in bed. -Toenails 1-5 b/l were debrided in length and girth with sterile nail nippers and dremel without iatrogenic bleeding.  -Per staff, patient using Nervive Pain Roll On before bedtime and this has managed her pain symptoms in her heels. Continue to monitor. -Patient/POA to call should there be question/concern in the interim.   Return in about 3 months (around 10/27/2021).  Freddie Breech, DPM

## 2021-08-25 DIAGNOSIS — I4891 Unspecified atrial fibrillation: Secondary | ICD-10-CM | POA: Diagnosis not present

## 2021-08-25 DIAGNOSIS — D6869 Other thrombophilia: Secondary | ICD-10-CM | POA: Diagnosis not present

## 2021-08-25 DIAGNOSIS — N1832 Chronic kidney disease, stage 3b: Secondary | ICD-10-CM | POA: Diagnosis not present

## 2021-08-25 DIAGNOSIS — K2960 Other gastritis without bleeding: Secondary | ICD-10-CM | POA: Diagnosis not present

## 2021-08-25 DIAGNOSIS — Z79899 Other long term (current) drug therapy: Secondary | ICD-10-CM | POA: Diagnosis not present

## 2021-08-25 DIAGNOSIS — Z0000 Encounter for general adult medical examination without abnormal findings: Secondary | ICD-10-CM | POA: Diagnosis not present

## 2021-08-25 DIAGNOSIS — E611 Iron deficiency: Secondary | ICD-10-CM | POA: Diagnosis not present

## 2021-08-25 DIAGNOSIS — I70213 Atherosclerosis of native arteries of extremities with intermittent claudication, bilateral legs: Secondary | ICD-10-CM | POA: Diagnosis not present

## 2021-08-25 DIAGNOSIS — Z8719 Personal history of other diseases of the digestive system: Secondary | ICD-10-CM | POA: Diagnosis not present

## 2021-08-25 DIAGNOSIS — D500 Iron deficiency anemia secondary to blood loss (chronic): Secondary | ICD-10-CM | POA: Diagnosis not present

## 2021-08-25 DIAGNOSIS — E785 Hyperlipidemia, unspecified: Secondary | ICD-10-CM | POA: Diagnosis not present

## 2021-08-25 DIAGNOSIS — E538 Deficiency of other specified B group vitamins: Secondary | ICD-10-CM | POA: Diagnosis not present

## 2021-08-31 DIAGNOSIS — R1013 Epigastric pain: Secondary | ICD-10-CM | POA: Diagnosis not present

## 2021-08-31 DIAGNOSIS — D649 Anemia, unspecified: Secondary | ICD-10-CM | POA: Diagnosis not present

## 2021-08-31 DIAGNOSIS — K625 Hemorrhage of anus and rectum: Secondary | ICD-10-CM | POA: Diagnosis not present

## 2021-08-31 DIAGNOSIS — K5900 Constipation, unspecified: Secondary | ICD-10-CM | POA: Diagnosis not present

## 2021-08-31 DIAGNOSIS — K5790 Diverticulosis of intestine, part unspecified, without perforation or abscess without bleeding: Secondary | ICD-10-CM | POA: Diagnosis not present

## 2021-08-31 DIAGNOSIS — R1310 Dysphagia, unspecified: Secondary | ICD-10-CM | POA: Diagnosis not present

## 2021-09-04 DIAGNOSIS — D519 Vitamin B12 deficiency anemia, unspecified: Secondary | ICD-10-CM | POA: Diagnosis not present

## 2021-09-07 DIAGNOSIS — I495 Sick sinus syndrome: Secondary | ICD-10-CM | POA: Diagnosis not present

## 2021-09-07 DIAGNOSIS — Z45018 Encounter for adjustment and management of other part of cardiac pacemaker: Secondary | ICD-10-CM | POA: Diagnosis not present

## 2021-09-14 DIAGNOSIS — E785 Hyperlipidemia, unspecified: Secondary | ICD-10-CM | POA: Diagnosis not present

## 2021-09-14 DIAGNOSIS — I10 Essential (primary) hypertension: Secondary | ICD-10-CM | POA: Diagnosis not present

## 2021-09-20 DIAGNOSIS — E538 Deficiency of other specified B group vitamins: Secondary | ICD-10-CM | POA: Diagnosis not present

## 2021-09-20 DIAGNOSIS — Z8719 Personal history of other diseases of the digestive system: Secondary | ICD-10-CM | POA: Diagnosis not present

## 2021-09-20 DIAGNOSIS — D519 Vitamin B12 deficiency anemia, unspecified: Secondary | ICD-10-CM | POA: Diagnosis not present

## 2021-09-20 DIAGNOSIS — E611 Iron deficiency: Secondary | ICD-10-CM | POA: Diagnosis not present

## 2021-09-26 DIAGNOSIS — R1310 Dysphagia, unspecified: Secondary | ICD-10-CM | POA: Diagnosis not present

## 2021-09-26 DIAGNOSIS — K649 Unspecified hemorrhoids: Secondary | ICD-10-CM | POA: Diagnosis not present

## 2021-09-26 DIAGNOSIS — D649 Anemia, unspecified: Secondary | ICD-10-CM | POA: Diagnosis not present

## 2021-09-26 DIAGNOSIS — R1013 Epigastric pain: Secondary | ICD-10-CM | POA: Diagnosis not present

## 2021-09-26 DIAGNOSIS — K5900 Constipation, unspecified: Secondary | ICD-10-CM | POA: Diagnosis not present

## 2021-09-26 DIAGNOSIS — K5790 Diverticulosis of intestine, part unspecified, without perforation or abscess without bleeding: Secondary | ICD-10-CM | POA: Diagnosis not present

## 2021-10-19 DIAGNOSIS — E538 Deficiency of other specified B group vitamins: Secondary | ICD-10-CM | POA: Diagnosis not present

## 2021-10-19 DIAGNOSIS — Z23 Encounter for immunization: Secondary | ICD-10-CM | POA: Diagnosis not present

## 2021-10-30 DIAGNOSIS — I480 Paroxysmal atrial fibrillation: Secondary | ICD-10-CM | POA: Diagnosis not present

## 2021-10-30 DIAGNOSIS — I10 Essential (primary) hypertension: Secondary | ICD-10-CM | POA: Diagnosis not present

## 2021-10-30 DIAGNOSIS — Z7901 Long term (current) use of anticoagulants: Secondary | ICD-10-CM | POA: Diagnosis not present

## 2021-10-30 DIAGNOSIS — E785 Hyperlipidemia, unspecified: Secondary | ICD-10-CM | POA: Diagnosis not present

## 2021-10-30 DIAGNOSIS — I495 Sick sinus syndrome: Secondary | ICD-10-CM | POA: Diagnosis not present

## 2021-10-30 DIAGNOSIS — Z950 Presence of cardiac pacemaker: Secondary | ICD-10-CM | POA: Diagnosis not present

## 2021-10-30 DIAGNOSIS — I358 Other nonrheumatic aortic valve disorders: Secondary | ICD-10-CM | POA: Diagnosis not present

## 2021-10-30 DIAGNOSIS — D649 Anemia, unspecified: Secondary | ICD-10-CM | POA: Diagnosis not present

## 2021-11-02 ENCOUNTER — Encounter: Payer: Self-pay | Admitting: Podiatry

## 2021-11-02 ENCOUNTER — Ambulatory Visit: Payer: Medicare Other | Admitting: Podiatry

## 2021-11-02 DIAGNOSIS — B351 Tinea unguium: Secondary | ICD-10-CM | POA: Diagnosis not present

## 2021-11-02 DIAGNOSIS — M79675 Pain in left toe(s): Secondary | ICD-10-CM

## 2021-11-02 DIAGNOSIS — I70203 Unspecified atherosclerosis of native arteries of extremities, bilateral legs: Secondary | ICD-10-CM

## 2021-11-02 DIAGNOSIS — M79674 Pain in right toe(s): Secondary | ICD-10-CM

## 2021-11-02 NOTE — Progress Notes (Signed)
    Subjective:  Patient ID: Kelly Terrell, female    DOB: [birth year],  MRN: [id number]  Kelly Terrell presents to clinic today for at risk foot care with h/o atherosclerosis. Chief Complaint  Patient presents with   Nail Problem    Routine foot care PCP-Street PCP [facility]- 1 month ago  . New problem(s): None.   She continues to wear her heel protector.  PCP is Street, Sharon Mt, MD.  Not on File  Review of Systems: Negative except as noted in the HPI.  Objective: No changes noted in today's physical examination.  Kelly Terrell is a pleasant 88 y.o. female WD, WN in NAD. AAO x 3.  Vascular Examination:  CFT <3 seconds b/l LE. Palpable DP pulse(s) b/l LE. Faintly palpable PT pulse(s) b/l LE. Pedal hair absent. No pain with calf compression b/l. Lower extremity skin temperature gradient within normal limits. +1 pitting edema noted BLE. No cyanosis or clubbing noted b/l LE.  Dermatological Examination: Pedal skin thin, shiny and atrophic b/l LE. No open wounds b/l LE. No interdigital macerations noted b/l LE. Toenails 1-5 b/l elongated, discolored, dystrophic, thickened, crumbly with subungual debris and tenderness to dorsal palpation. No hyperkeratosis noted posterior heel LLE today. No pressure injuries noted today.  Musculoskeletal: Noted disuse atrophy bilaterally. No pain, crepitus or joint limitation noted with ROM bilateral LE. HAV with bunion deformity noted b/l LE.  Neurological: Protective sensation intact 5/5 intact bilaterally with 10g monofilament b/l. Vibratory sensation intact b/l.  Assessment/Plan: 1. Pain due to onychomycosis of toenails of both feet   2. Atherosclerosis of artery of both lower extremities ([place])     No orders of the defined types were placed in this encounter.   -Consent given for treatment as described below: -Examined patient. -Facility to continue pressure precautions.  -Mycotic toenails 1-5 bilaterally were debrided in length and girth with  sterile nail nippers and dremel without incident. -Patient/POA to call should there be question/concern in the interim.   Return in about 3 months (around 02/02/2022).  Marzetta Board, DPM

## 2021-11-17 DIAGNOSIS — E538 Deficiency of other specified B group vitamins: Secondary | ICD-10-CM | POA: Diagnosis not present

## 2021-11-30 DIAGNOSIS — D500 Iron deficiency anemia secondary to blood loss (chronic): Secondary | ICD-10-CM | POA: Diagnosis not present

## 2021-11-30 DIAGNOSIS — M4800 Spinal stenosis, site unspecified: Secondary | ICD-10-CM | POA: Diagnosis not present

## 2021-11-30 DIAGNOSIS — D6869 Other thrombophilia: Secondary | ICD-10-CM | POA: Diagnosis not present

## 2021-11-30 DIAGNOSIS — Z8719 Personal history of other diseases of the digestive system: Secondary | ICD-10-CM | POA: Diagnosis not present

## 2021-11-30 DIAGNOSIS — M1991 Primary osteoarthritis, unspecified site: Secondary | ICD-10-CM | POA: Diagnosis not present

## 2021-11-30 DIAGNOSIS — I4891 Unspecified atrial fibrillation: Secondary | ICD-10-CM | POA: Diagnosis not present

## 2021-11-30 DIAGNOSIS — E611 Iron deficiency: Secondary | ICD-10-CM | POA: Diagnosis not present

## 2021-11-30 DIAGNOSIS — E538 Deficiency of other specified B group vitamins: Secondary | ICD-10-CM | POA: Diagnosis not present

## 2021-11-30 DIAGNOSIS — I70213 Atherosclerosis of native arteries of extremities with intermittent claudication, bilateral legs: Secondary | ICD-10-CM | POA: Diagnosis not present

## 2021-12-15 DIAGNOSIS — E538 Deficiency of other specified B group vitamins: Secondary | ICD-10-CM | POA: Diagnosis not present

## 2021-12-16 DIAGNOSIS — I4719 Other supraventricular tachycardia: Secondary | ICD-10-CM | POA: Diagnosis not present

## 2021-12-16 DIAGNOSIS — I4891 Unspecified atrial fibrillation: Secondary | ICD-10-CM | POA: Diagnosis not present

## 2021-12-16 DIAGNOSIS — Z45018 Encounter for adjustment and management of other part of cardiac pacemaker: Secondary | ICD-10-CM | POA: Diagnosis not present

## 2022-01-17 DIAGNOSIS — E538 Deficiency of other specified B group vitamins: Secondary | ICD-10-CM | POA: Diagnosis not present

## 2022-02-07 DIAGNOSIS — E559 Vitamin D deficiency, unspecified: Secondary | ICD-10-CM | POA: Diagnosis not present

## 2022-02-14 DIAGNOSIS — I10 Essential (primary) hypertension: Secondary | ICD-10-CM | POA: Diagnosis not present

## 2022-02-14 DIAGNOSIS — I4891 Unspecified atrial fibrillation: Secondary | ICD-10-CM | POA: Diagnosis not present

## 2022-02-15 ENCOUNTER — Encounter: Payer: Self-pay | Admitting: Podiatry

## 2022-02-15 ENCOUNTER — Ambulatory Visit: Payer: Medicare Other | Admitting: Podiatry

## 2022-02-15 VITALS — BP 140/107

## 2022-02-15 DIAGNOSIS — M79674 Pain in right toe(s): Secondary | ICD-10-CM | POA: Diagnosis not present

## 2022-02-15 DIAGNOSIS — M79675 Pain in left toe(s): Secondary | ICD-10-CM

## 2022-02-15 DIAGNOSIS — B351 Tinea unguium: Secondary | ICD-10-CM

## 2022-02-15 DIAGNOSIS — I70203 Unspecified atherosclerosis of native arteries of extremities, bilateral legs: Secondary | ICD-10-CM | POA: Diagnosis not present

## 2022-02-15 DIAGNOSIS — D519 Vitamin B12 deficiency anemia, unspecified: Secondary | ICD-10-CM | POA: Diagnosis not present

## 2022-02-15 NOTE — Progress Notes (Signed)
  Subjective:  Patient ID: Kelly Terrell, female    DOB: 14-Sep-1929,  MRN: [id number]  Kelly Terrell presents to clinic today for at risk foot care. Patient has h/o PAD and painful thick toenails that are difficult to trim. Pain interferes with ambulation. Aggravating factors include wearing enclosed shoe gear. Pain is relieved with periodic professional debridement.  Chief Complaint  Patient presents with   Nail Problem    [facility] PCP-Street PCP VST- 2 weeks   New problem(s): None.   PCP is Street, Sharon Mt, MD.  No Known Allergies  Review of Systems: Negative except as noted in the HPI.  Objective:  Vitals:   02/15/22 1555 02/15/22 1602  BP: (!) 150/79 (!) 140/107  Patient states she feels fine with no dizziness, no chest pain, no SOB, no blurred vision, no headaches, no neck/arm or shoulder pain.  Kelly Terrell is a pleasant 89 y.o. female WD, WN in NAD. AAO x 3.  Vascular Examination:  CFT <3 seconds b/l LE. Palpable DP pulse(s) b/l LE. Faintly palpable PT pulse(s) b/l LE. Pedal hair absent. No pain with calf compression b/l. Lower extremity skin temperature gradient within normal limits. +1 pitting edema noted BLE. No cyanosis or clubbing noted b/l LE.  Dermatological Examination: Pedal skin thin, shiny and atrophic b/l LE. No open wounds b/l LE. No interdigital macerations noted b/l LE.   Toenails 1-5 b/l elongated, discolored, dystrophic, thickened, crumbly with subungual debris and tenderness to dorsal palpation.   Left great toenail embedded distally with tenderness to palpation. No erythema, no edema, no drainage, no fluctuance.  No hyperkeratosis noted posterior heel LLE today.   No pressure injuries noted today.  Musculoskeletal: Noted disuse atrophy bilaterally. No pain, crepitus or joint limitation noted with ROM bilateral LE. HAV with bunion deformity noted b/l LE. Utilizes rollator for gait assistance.  Neurological: Protective sensation intact 5/5 intact  bilaterally with 10g monofilament b/l. Vibratory sensation intact b/l.  Assessment/Plan: 1. Pain due to onychomycosis of toenails of both feet   2. Atherosclerosis of artery of both lower extremities ([place])    -Patient was evaluated and treated. All patient's and/or POA's questions/concerns answered on today's visit. -Facility to continue fall precautions and pressure precautions. -Patient to continue soft, supportive shoe gear daily. -Toenails 1-5 b/l were debrided in length and girth with sterile nail nippers and dremel without iatrogenic bleeding.  -No invasive procedure(s) performed. Offending nail border debrided and curretaged left great toe utilizing sterile nail nipper and currette. Border(s) cleansed with alcohol and triple antibiotic ointment applied. Patient/POA/Caregiver/Facility instructed to apply Neosporin Cream  to left great toe once daily for 7 days. Call office if there are any concerns. -Patient/POA to call should there be question/concern in the interim.   Return in about 3 months (around 05/16/2022).  Marzetta Board, DPM

## 2022-03-15 DIAGNOSIS — I4891 Unspecified atrial fibrillation: Secondary | ICD-10-CM | POA: Diagnosis not present

## 2022-03-15 DIAGNOSIS — I10 Essential (primary) hypertension: Secondary | ICD-10-CM | POA: Diagnosis not present

## 2022-03-17 DIAGNOSIS — R21 Rash and other nonspecific skin eruption: Secondary | ICD-10-CM | POA: Diagnosis not present

## 2022-03-17 DIAGNOSIS — B029 Zoster without complications: Secondary | ICD-10-CM | POA: Diagnosis not present

## 2022-03-17 DIAGNOSIS — R0981 Nasal congestion: Secondary | ICD-10-CM | POA: Diagnosis not present

## 2022-03-17 DIAGNOSIS — J209 Acute bronchitis, unspecified: Secondary | ICD-10-CM | POA: Diagnosis not present

## 2022-03-18 DIAGNOSIS — I4719 Other supraventricular tachycardia: Secondary | ICD-10-CM | POA: Diagnosis not present

## 2022-03-18 DIAGNOSIS — I4891 Unspecified atrial fibrillation: Secondary | ICD-10-CM | POA: Diagnosis not present

## 2022-03-18 DIAGNOSIS — Z45018 Encounter for adjustment and management of other part of cardiac pacemaker: Secondary | ICD-10-CM | POA: Diagnosis not present

## 2022-04-02 DIAGNOSIS — E785 Hyperlipidemia, unspecified: Secondary | ICD-10-CM | POA: Diagnosis not present

## 2022-04-02 DIAGNOSIS — M4800 Spinal stenosis, site unspecified: Secondary | ICD-10-CM | POA: Diagnosis not present

## 2022-04-02 DIAGNOSIS — I10 Essential (primary) hypertension: Secondary | ICD-10-CM | POA: Diagnosis not present

## 2022-04-02 DIAGNOSIS — E559 Vitamin D deficiency, unspecified: Secondary | ICD-10-CM | POA: Diagnosis not present

## 2022-04-02 DIAGNOSIS — D500 Iron deficiency anemia secondary to blood loss (chronic): Secondary | ICD-10-CM | POA: Diagnosis not present

## 2022-04-02 DIAGNOSIS — D6869 Other thrombophilia: Secondary | ICD-10-CM | POA: Diagnosis not present

## 2022-04-02 DIAGNOSIS — I4891 Unspecified atrial fibrillation: Secondary | ICD-10-CM | POA: Diagnosis not present

## 2022-04-02 DIAGNOSIS — M1991 Primary osteoarthritis, unspecified site: Secondary | ICD-10-CM | POA: Diagnosis not present

## 2022-04-02 DIAGNOSIS — E538 Deficiency of other specified B group vitamins: Secondary | ICD-10-CM | POA: Diagnosis not present

## 2022-04-02 DIAGNOSIS — J42 Unspecified chronic bronchitis: Secondary | ICD-10-CM | POA: Diagnosis not present

## 2022-04-02 DIAGNOSIS — R300 Dysuria: Secondary | ICD-10-CM | POA: Diagnosis not present

## 2022-04-02 DIAGNOSIS — Z79899 Other long term (current) drug therapy: Secondary | ICD-10-CM | POA: Diagnosis not present

## 2022-04-03 DIAGNOSIS — E538 Deficiency of other specified B group vitamins: Secondary | ICD-10-CM | POA: Diagnosis not present

## 2022-04-18 ENCOUNTER — Encounter: Payer: Self-pay | Admitting: Podiatry

## 2022-04-18 ENCOUNTER — Ambulatory Visit: Payer: Medicare Other | Admitting: Podiatry

## 2022-04-18 DIAGNOSIS — M79675 Pain in left toe(s): Secondary | ICD-10-CM

## 2022-04-18 DIAGNOSIS — I70203 Unspecified atherosclerosis of native arteries of extremities, bilateral legs: Secondary | ICD-10-CM | POA: Diagnosis not present

## 2022-04-18 DIAGNOSIS — B351 Tinea unguium: Secondary | ICD-10-CM | POA: Diagnosis not present

## 2022-04-18 DIAGNOSIS — M79674 Pain in right toe(s): Secondary | ICD-10-CM | POA: Diagnosis not present

## 2022-04-18 NOTE — Progress Notes (Signed)
  Subjective:  Patient ID: Kelly Terrell, female    DOB: February 04, 1929,   MRN: [id number]  Chief Complaint  Patient presents with   routine foot care    89 y.o. female presents for concern of thickened elongated and painful nails that are difficult to trim. Requesting to have them trimmed today. History of PAD.  PCP:  Street, Sharon Mt, MD     . Denies any other pedal complaints. Denies n/v/f/c.   History reviewed. No pertinent past medical history.  Objective:  Physical Exam: Vascular: DP/PT pulses 1/4 bilateral. CFT <3 seconds. Absent hair growth on digits. Edema noted to bilateral lower extremities. Xerosis noted bilaterally.  Skin. No lacerations or abrasions bilateral feet. Nails 1-5 bilateral  are thickened discolored and elongated with subungual debris.  Musculoskeletal: MMT 5/5 bilateral lower extremities in DF, PF, Inversion and Eversion. Deceased ROM in DF of ankle joint.  Neurological: Sensation intact to light touch. Protective sensation intact bilateral.    Assessment:   1. Pain due to onychomycosis of toenails of both feet   2. Atherosclerosis of artery of both lower extremities      Plan:  Patient was evaluated and treated and all questions answered. -Mechanically debrided all nails 1-5 bilateral using sterile nail nipper and filed with dremel without incident  -Answered all patient questions -Patient to return  in 3 months for at risk foot care -Patient advised to call the office if any problems or questions arise in the meantime.   Lorenda Peck, DPM

## 2022-05-02 DIAGNOSIS — Z743 Need for continuous supervision: Secondary | ICD-10-CM | POA: Diagnosis not present

## 2022-05-02 DIAGNOSIS — K631 Perforation of intestine (nontraumatic): Secondary | ICD-10-CM | POA: Diagnosis not present

## 2022-05-02 DIAGNOSIS — R6889 Other general symptoms and signs: Secondary | ICD-10-CM | POA: Diagnosis not present

## 2022-05-02 DIAGNOSIS — J441 Chronic obstructive pulmonary disease with (acute) exacerbation: Secondary | ICD-10-CM | POA: Diagnosis not present

## 2022-05-02 DIAGNOSIS — R0602 Shortness of breath: Secondary | ICD-10-CM | POA: Diagnosis not present

## 2022-05-02 DIAGNOSIS — I444 Left anterior fascicular block: Secondary | ICD-10-CM | POA: Diagnosis not present

## 2022-05-02 DIAGNOSIS — D72829 Elevated white blood cell count, unspecified: Secondary | ICD-10-CM | POA: Diagnosis not present

## 2022-05-02 DIAGNOSIS — R059 Cough, unspecified: Secondary | ICD-10-CM | POA: Diagnosis not present

## 2022-05-02 DIAGNOSIS — K668 Other specified disorders of peritoneum: Secondary | ICD-10-CM | POA: Diagnosis not present

## 2022-05-02 DIAGNOSIS — R9431 Abnormal electrocardiogram [ECG] [EKG]: Secondary | ICD-10-CM | POA: Diagnosis not present

## 2022-05-02 DIAGNOSIS — J9601 Acute respiratory failure with hypoxia: Secondary | ICD-10-CM | POA: Diagnosis not present

## 2022-05-03 DIAGNOSIS — J9601 Acute respiratory failure with hypoxia: Secondary | ICD-10-CM | POA: Diagnosis not present

## 2022-05-03 DIAGNOSIS — K631 Perforation of intestine (nontraumatic): Secondary | ICD-10-CM | POA: Diagnosis not present

## 2022-05-04 DIAGNOSIS — J9601 Acute respiratory failure with hypoxia: Secondary | ICD-10-CM | POA: Diagnosis not present

## 2022-05-04 DIAGNOSIS — K631 Perforation of intestine (nontraumatic): Secondary | ICD-10-CM | POA: Diagnosis not present

## 2022-05-05 DIAGNOSIS — J9601 Acute respiratory failure with hypoxia: Secondary | ICD-10-CM | POA: Diagnosis not present

## 2022-05-05 DIAGNOSIS — K631 Perforation of intestine (nontraumatic): Secondary | ICD-10-CM | POA: Diagnosis not present

## 2022-05-06 DIAGNOSIS — R109 Unspecified abdominal pain: Secondary | ICD-10-CM | POA: Diagnosis not present

## 2022-05-07 DIAGNOSIS — Z87440 Personal history of urinary (tract) infections: Secondary | ICD-10-CM | POA: Diagnosis not present

## 2022-05-07 DIAGNOSIS — E7800 Pure hypercholesterolemia, unspecified: Secondary | ICD-10-CM | POA: Diagnosis not present

## 2022-05-07 DIAGNOSIS — K668 Other specified disorders of peritoneum: Secondary | ICD-10-CM | POA: Diagnosis not present

## 2022-05-07 DIAGNOSIS — N183 Chronic kidney disease, stage 3 unspecified: Secondary | ICD-10-CM | POA: Diagnosis not present

## 2022-05-07 DIAGNOSIS — I4811 Longstanding persistent atrial fibrillation: Secondary | ICD-10-CM | POA: Diagnosis not present

## 2022-05-07 DIAGNOSIS — E871 Hypo-osmolality and hyponatremia: Secondary | ICD-10-CM | POA: Diagnosis not present

## 2022-05-07 DIAGNOSIS — K449 Diaphragmatic hernia without obstruction or gangrene: Secondary | ICD-10-CM | POA: Diagnosis not present

## 2022-05-07 DIAGNOSIS — R300 Dysuria: Secondary | ICD-10-CM | POA: Diagnosis not present

## 2022-05-07 DIAGNOSIS — J9601 Acute respiratory failure with hypoxia: Secondary | ICD-10-CM | POA: Diagnosis not present

## 2022-05-07 DIAGNOSIS — Z452 Encounter for adjustment and management of vascular access device: Secondary | ICD-10-CM | POA: Diagnosis not present

## 2022-05-07 DIAGNOSIS — I495 Sick sinus syndrome: Secondary | ICD-10-CM | POA: Diagnosis not present

## 2022-05-07 DIAGNOSIS — D62 Acute posthemorrhagic anemia: Secondary | ICD-10-CM | POA: Diagnosis not present

## 2022-05-07 DIAGNOSIS — R109 Unspecified abdominal pain: Secondary | ICD-10-CM | POA: Diagnosis not present

## 2022-05-07 DIAGNOSIS — I509 Heart failure, unspecified: Secondary | ICD-10-CM | POA: Diagnosis not present

## 2022-05-07 DIAGNOSIS — R2681 Unsteadiness on feet: Secondary | ICD-10-CM | POA: Diagnosis not present

## 2022-05-07 DIAGNOSIS — J90 Pleural effusion, not elsewhere classified: Secondary | ICD-10-CM | POA: Diagnosis not present

## 2022-05-07 DIAGNOSIS — M1990 Unspecified osteoarthritis, unspecified site: Secondary | ICD-10-CM | POA: Diagnosis not present

## 2022-05-07 DIAGNOSIS — I700 Atherosclerosis of aorta: Secondary | ICD-10-CM | POA: Diagnosis not present

## 2022-05-07 DIAGNOSIS — K631 Perforation of intestine (nontraumatic): Secondary | ICD-10-CM | POA: Diagnosis not present

## 2022-05-07 DIAGNOSIS — E538 Deficiency of other specified B group vitamins: Secondary | ICD-10-CM | POA: Diagnosis not present

## 2022-05-07 DIAGNOSIS — M7989 Other specified soft tissue disorders: Secondary | ICD-10-CM | POA: Diagnosis not present

## 2022-05-07 DIAGNOSIS — Z741 Need for assistance with personal care: Secondary | ICD-10-CM | POA: Diagnosis not present

## 2022-05-07 DIAGNOSIS — Z743 Need for continuous supervision: Secondary | ICD-10-CM | POA: Diagnosis not present

## 2022-05-07 DIAGNOSIS — I110 Hypertensive heart disease with heart failure: Secondary | ICD-10-CM | POA: Diagnosis not present

## 2022-05-07 DIAGNOSIS — J9811 Atelectasis: Secondary | ICD-10-CM | POA: Diagnosis not present

## 2022-05-07 DIAGNOSIS — M159 Polyosteoarthritis, unspecified: Secondary | ICD-10-CM | POA: Diagnosis not present

## 2022-05-07 DIAGNOSIS — J440 Chronic obstructive pulmonary disease with acute lower respiratory infection: Secondary | ICD-10-CM | POA: Diagnosis not present

## 2022-05-07 DIAGNOSIS — Z950 Presence of cardiac pacemaker: Secondary | ICD-10-CM | POA: Diagnosis not present

## 2022-05-07 DIAGNOSIS — K219 Gastro-esophageal reflux disease without esophagitis: Secondary | ICD-10-CM | POA: Diagnosis not present

## 2022-05-07 DIAGNOSIS — N179 Acute kidney failure, unspecified: Secondary | ICD-10-CM | POA: Diagnosis not present

## 2022-05-07 DIAGNOSIS — J441 Chronic obstructive pulmonary disease with (acute) exacerbation: Secondary | ICD-10-CM | POA: Diagnosis not present

## 2022-05-07 DIAGNOSIS — R279 Unspecified lack of coordination: Secondary | ICD-10-CM | POA: Diagnosis not present

## 2022-05-07 DIAGNOSIS — D649 Anemia, unspecified: Secondary | ICD-10-CM | POA: Diagnosis not present

## 2022-05-07 DIAGNOSIS — K5720 Diverticulitis of large intestine with perforation and abscess without bleeding: Secondary | ICD-10-CM | POA: Diagnosis not present

## 2022-05-07 DIAGNOSIS — Z87891 Personal history of nicotine dependence: Secondary | ICD-10-CM | POA: Diagnosis not present

## 2022-05-07 DIAGNOSIS — E559 Vitamin D deficiency, unspecified: Secondary | ICD-10-CM | POA: Diagnosis not present

## 2022-05-07 DIAGNOSIS — I130 Hypertensive heart and chronic kidney disease with heart failure and stage 1 through stage 4 chronic kidney disease, or unspecified chronic kidney disease: Secondary | ICD-10-CM | POA: Diagnosis not present

## 2022-05-07 DIAGNOSIS — E440 Moderate protein-calorie malnutrition: Secondary | ICD-10-CM | POA: Diagnosis not present

## 2022-05-07 DIAGNOSIS — M6281 Muscle weakness (generalized): Secondary | ICD-10-CM | POA: Diagnosis not present

## 2022-05-07 DIAGNOSIS — K5900 Constipation, unspecified: Secondary | ICD-10-CM | POA: Diagnosis not present

## 2022-05-07 DIAGNOSIS — Z7401 Bed confinement status: Secondary | ICD-10-CM | POA: Diagnosis not present

## 2022-05-07 DIAGNOSIS — Z48815 Encounter for surgical aftercare following surgery on the digestive system: Secondary | ICD-10-CM | POA: Diagnosis not present

## 2022-05-07 DIAGNOSIS — I480 Paroxysmal atrial fibrillation: Secondary | ICD-10-CM | POA: Diagnosis not present

## 2022-05-07 DIAGNOSIS — E782 Mixed hyperlipidemia: Secondary | ICD-10-CM | POA: Diagnosis not present

## 2022-05-07 DIAGNOSIS — F32A Depression, unspecified: Secondary | ICD-10-CM | POA: Diagnosis not present

## 2022-05-15 DIAGNOSIS — J9621 Acute and chronic respiratory failure with hypoxia: Secondary | ICD-10-CM | POA: Diagnosis not present

## 2022-05-15 DIAGNOSIS — Z87891 Personal history of nicotine dependence: Secondary | ICD-10-CM | POA: Diagnosis not present

## 2022-05-15 DIAGNOSIS — C189 Malignant neoplasm of colon, unspecified: Secondary | ICD-10-CM | POA: Diagnosis not present

## 2022-05-15 DIAGNOSIS — Z9049 Acquired absence of other specified parts of digestive tract: Secondary | ICD-10-CM | POA: Diagnosis not present

## 2022-05-15 DIAGNOSIS — Z741 Need for assistance with personal care: Secondary | ICD-10-CM | POA: Diagnosis not present

## 2022-05-15 DIAGNOSIS — I495 Sick sinus syndrome: Secondary | ICD-10-CM | POA: Diagnosis not present

## 2022-05-15 DIAGNOSIS — E559 Vitamin D deficiency, unspecified: Secondary | ICD-10-CM | POA: Diagnosis not present

## 2022-05-15 DIAGNOSIS — D649 Anemia, unspecified: Secondary | ICD-10-CM | POA: Diagnosis not present

## 2022-05-15 DIAGNOSIS — I517 Cardiomegaly: Secondary | ICD-10-CM | POA: Diagnosis not present

## 2022-05-15 DIAGNOSIS — I358 Other nonrheumatic aortic valve disorders: Secondary | ICD-10-CM | POA: Diagnosis not present

## 2022-05-15 DIAGNOSIS — N179 Acute kidney failure, unspecified: Secondary | ICD-10-CM | POA: Diagnosis not present

## 2022-05-15 DIAGNOSIS — I480 Paroxysmal atrial fibrillation: Secondary | ICD-10-CM | POA: Diagnosis not present

## 2022-05-15 DIAGNOSIS — Z7401 Bed confinement status: Secondary | ICD-10-CM | POA: Diagnosis not present

## 2022-05-15 DIAGNOSIS — E11649 Type 2 diabetes mellitus with hypoglycemia without coma: Secondary | ICD-10-CM | POA: Diagnosis not present

## 2022-05-15 DIAGNOSIS — E785 Hyperlipidemia, unspecified: Secondary | ICD-10-CM | POA: Diagnosis not present

## 2022-05-15 DIAGNOSIS — I2489 Other forms of acute ischemic heart disease: Secondary | ICD-10-CM | POA: Diagnosis not present

## 2022-05-15 DIAGNOSIS — R300 Dysuria: Secondary | ICD-10-CM | POA: Diagnosis not present

## 2022-05-15 DIAGNOSIS — D519 Vitamin B12 deficiency anemia, unspecified: Secondary | ICD-10-CM | POA: Diagnosis not present

## 2022-05-15 DIAGNOSIS — J9811 Atelectasis: Secondary | ICD-10-CM | POA: Diagnosis not present

## 2022-05-15 DIAGNOSIS — J449 Chronic obstructive pulmonary disease, unspecified: Secondary | ICD-10-CM | POA: Diagnosis not present

## 2022-05-15 DIAGNOSIS — I5033 Acute on chronic diastolic (congestive) heart failure: Secondary | ICD-10-CM | POA: Diagnosis not present

## 2022-05-15 DIAGNOSIS — N183 Chronic kidney disease, stage 3 unspecified: Secondary | ICD-10-CM | POA: Diagnosis not present

## 2022-05-15 DIAGNOSIS — Z950 Presence of cardiac pacemaker: Secondary | ICD-10-CM | POA: Diagnosis not present

## 2022-05-15 DIAGNOSIS — R2681 Unsteadiness on feet: Secondary | ICD-10-CM | POA: Diagnosis not present

## 2022-05-15 DIAGNOSIS — I509 Heart failure, unspecified: Secondary | ICD-10-CM | POA: Diagnosis not present

## 2022-05-15 DIAGNOSIS — J9601 Acute respiratory failure with hypoxia: Secondary | ICD-10-CM | POA: Diagnosis not present

## 2022-05-15 DIAGNOSIS — E538 Deficiency of other specified B group vitamins: Secondary | ICD-10-CM | POA: Diagnosis not present

## 2022-05-15 DIAGNOSIS — K631 Perforation of intestine (nontraumatic): Secondary | ICD-10-CM | POA: Diagnosis not present

## 2022-05-15 DIAGNOSIS — E782 Mixed hyperlipidemia: Secondary | ICD-10-CM | POA: Diagnosis not present

## 2022-05-15 DIAGNOSIS — Z95810 Presence of automatic (implantable) cardiac defibrillator: Secondary | ICD-10-CM | POA: Diagnosis not present

## 2022-05-15 DIAGNOSIS — Z743 Need for continuous supervision: Secondary | ICD-10-CM | POA: Diagnosis not present

## 2022-05-15 DIAGNOSIS — K222 Esophageal obstruction: Secondary | ICD-10-CM | POA: Diagnosis not present

## 2022-05-15 DIAGNOSIS — I110 Hypertensive heart disease with heart failure: Secondary | ICD-10-CM | POA: Diagnosis not present

## 2022-05-15 DIAGNOSIS — J441 Chronic obstructive pulmonary disease with (acute) exacerbation: Secondary | ICD-10-CM | POA: Diagnosis not present

## 2022-05-15 DIAGNOSIS — Z66 Do not resuscitate: Secondary | ICD-10-CM | POA: Diagnosis not present

## 2022-05-15 DIAGNOSIS — J90 Pleural effusion, not elsewhere classified: Secondary | ICD-10-CM | POA: Diagnosis not present

## 2022-05-15 DIAGNOSIS — R279 Unspecified lack of coordination: Secondary | ICD-10-CM | POA: Diagnosis not present

## 2022-05-15 DIAGNOSIS — Z933 Colostomy status: Secondary | ICD-10-CM | POA: Diagnosis not present

## 2022-05-15 DIAGNOSIS — M6281 Muscle weakness (generalized): Secondary | ICD-10-CM | POA: Diagnosis not present

## 2022-05-15 DIAGNOSIS — K625 Hemorrhage of anus and rectum: Secondary | ICD-10-CM | POA: Diagnosis not present

## 2022-05-15 DIAGNOSIS — R0602 Shortness of breath: Secondary | ICD-10-CM | POA: Diagnosis not present

## 2022-05-15 DIAGNOSIS — Z9981 Dependence on supplemental oxygen: Secondary | ICD-10-CM | POA: Diagnosis not present

## 2022-05-15 DIAGNOSIS — Z48815 Encounter for surgical aftercare following surgery on the digestive system: Secondary | ICD-10-CM | POA: Diagnosis not present

## 2022-05-15 DIAGNOSIS — M159 Polyosteoarthritis, unspecified: Secondary | ICD-10-CM | POA: Diagnosis not present

## 2022-05-15 DIAGNOSIS — J9622 Acute and chronic respiratory failure with hypercapnia: Secondary | ICD-10-CM | POA: Diagnosis not present

## 2022-05-15 DIAGNOSIS — K219 Gastro-esophageal reflux disease without esophagitis: Secondary | ICD-10-CM | POA: Diagnosis not present

## 2022-05-15 DIAGNOSIS — E440 Moderate protein-calorie malnutrition: Secondary | ICD-10-CM | POA: Diagnosis not present

## 2022-05-15 DIAGNOSIS — R188 Other ascites: Secondary | ICD-10-CM | POA: Diagnosis not present

## 2022-05-15 DIAGNOSIS — I272 Pulmonary hypertension, unspecified: Secondary | ICD-10-CM | POA: Diagnosis not present

## 2022-05-16 DIAGNOSIS — E559 Vitamin D deficiency, unspecified: Secondary | ICD-10-CM | POA: Diagnosis not present

## 2022-05-16 DIAGNOSIS — E782 Mixed hyperlipidemia: Secondary | ICD-10-CM | POA: Diagnosis not present

## 2022-05-16 DIAGNOSIS — J441 Chronic obstructive pulmonary disease with (acute) exacerbation: Secondary | ICD-10-CM | POA: Diagnosis not present

## 2022-05-16 DIAGNOSIS — E538 Deficiency of other specified B group vitamins: Secondary | ICD-10-CM | POA: Diagnosis not present

## 2022-05-16 DIAGNOSIS — I509 Heart failure, unspecified: Secondary | ICD-10-CM | POA: Diagnosis not present

## 2022-05-16 DIAGNOSIS — K219 Gastro-esophageal reflux disease without esophagitis: Secondary | ICD-10-CM | POA: Diagnosis not present

## 2022-05-16 DIAGNOSIS — I480 Paroxysmal atrial fibrillation: Secondary | ICD-10-CM | POA: Diagnosis not present

## 2022-05-17 DIAGNOSIS — I509 Heart failure, unspecified: Secondary | ICD-10-CM | POA: Diagnosis not present

## 2022-05-17 DIAGNOSIS — I110 Hypertensive heart disease with heart failure: Secondary | ICD-10-CM | POA: Diagnosis not present

## 2022-05-17 DIAGNOSIS — E782 Mixed hyperlipidemia: Secondary | ICD-10-CM | POA: Diagnosis not present

## 2022-05-17 DIAGNOSIS — K219 Gastro-esophageal reflux disease without esophagitis: Secondary | ICD-10-CM | POA: Diagnosis not present

## 2022-05-17 DIAGNOSIS — E538 Deficiency of other specified B group vitamins: Secondary | ICD-10-CM | POA: Diagnosis not present

## 2022-05-17 DIAGNOSIS — M159 Polyosteoarthritis, unspecified: Secondary | ICD-10-CM | POA: Diagnosis not present

## 2022-05-17 DIAGNOSIS — E559 Vitamin D deficiency, unspecified: Secondary | ICD-10-CM | POA: Diagnosis not present

## 2022-05-17 DIAGNOSIS — J441 Chronic obstructive pulmonary disease with (acute) exacerbation: Secondary | ICD-10-CM | POA: Diagnosis not present

## 2022-05-17 DIAGNOSIS — I480 Paroxysmal atrial fibrillation: Secondary | ICD-10-CM | POA: Diagnosis not present

## 2022-05-17 DIAGNOSIS — K631 Perforation of intestine (nontraumatic): Secondary | ICD-10-CM | POA: Diagnosis not present

## 2022-05-21 DIAGNOSIS — Z933 Colostomy status: Secondary | ICD-10-CM | POA: Diagnosis not present

## 2022-05-21 DIAGNOSIS — J441 Chronic obstructive pulmonary disease with (acute) exacerbation: Secondary | ICD-10-CM | POA: Diagnosis not present

## 2022-05-21 DIAGNOSIS — Z950 Presence of cardiac pacemaker: Secondary | ICD-10-CM | POA: Diagnosis not present

## 2022-05-21 DIAGNOSIS — Z9049 Acquired absence of other specified parts of digestive tract: Secondary | ICD-10-CM | POA: Diagnosis not present

## 2022-05-22 DIAGNOSIS — C189 Malignant neoplasm of colon, unspecified: Secondary | ICD-10-CM | POA: Diagnosis not present

## 2022-05-24 DIAGNOSIS — J441 Chronic obstructive pulmonary disease with (acute) exacerbation: Secondary | ICD-10-CM | POA: Diagnosis not present

## 2022-05-24 DIAGNOSIS — I509 Heart failure, unspecified: Secondary | ICD-10-CM | POA: Diagnosis not present

## 2022-05-27 DIAGNOSIS — Z87891 Personal history of nicotine dependence: Secondary | ICD-10-CM | POA: Diagnosis not present

## 2022-05-27 DIAGNOSIS — Z4659 Encounter for fitting and adjustment of other gastrointestinal appliance and device: Secondary | ICD-10-CM | POA: Diagnosis not present

## 2022-05-27 DIAGNOSIS — J9622 Acute and chronic respiratory failure with hypercapnia: Secondary | ICD-10-CM | POA: Diagnosis not present

## 2022-05-27 DIAGNOSIS — I495 Sick sinus syndrome: Secondary | ICD-10-CM | POA: Diagnosis not present

## 2022-05-27 DIAGNOSIS — R2232 Localized swelling, mass and lump, left upper limb: Secondary | ICD-10-CM | POA: Diagnosis not present

## 2022-05-27 DIAGNOSIS — Z743 Need for continuous supervision: Secondary | ICD-10-CM | POA: Diagnosis not present

## 2022-05-27 DIAGNOSIS — J810 Acute pulmonary edema: Secondary | ICD-10-CM | POA: Diagnosis not present

## 2022-05-27 DIAGNOSIS — I517 Cardiomegaly: Secondary | ICD-10-CM | POA: Diagnosis not present

## 2022-05-27 DIAGNOSIS — I5043 Acute on chronic combined systolic (congestive) and diastolic (congestive) heart failure: Secondary | ICD-10-CM | POA: Diagnosis not present

## 2022-05-27 DIAGNOSIS — R1319 Other dysphagia: Secondary | ICD-10-CM | POA: Diagnosis not present

## 2022-05-27 DIAGNOSIS — J441 Chronic obstructive pulmonary disease with (acute) exacerbation: Secondary | ICD-10-CM | POA: Diagnosis not present

## 2022-05-27 DIAGNOSIS — Z933 Colostomy status: Secondary | ICD-10-CM | POA: Diagnosis not present

## 2022-05-27 DIAGNOSIS — I272 Pulmonary hypertension, unspecified: Secondary | ICD-10-CM | POA: Diagnosis not present

## 2022-05-27 DIAGNOSIS — K208 Other esophagitis without bleeding: Secondary | ICD-10-CM | POA: Diagnosis not present

## 2022-05-27 DIAGNOSIS — J9690 Respiratory failure, unspecified, unspecified whether with hypoxia or hypercapnia: Secondary | ICD-10-CM | POA: Diagnosis not present

## 2022-05-27 DIAGNOSIS — E785 Hyperlipidemia, unspecified: Secondary | ICD-10-CM | POA: Diagnosis not present

## 2022-05-27 DIAGNOSIS — J9621 Acute and chronic respiratory failure with hypoxia: Secondary | ICD-10-CM | POA: Diagnosis not present

## 2022-05-27 DIAGNOSIS — R188 Other ascites: Secondary | ICD-10-CM | POA: Diagnosis not present

## 2022-05-27 DIAGNOSIS — R7989 Other specified abnormal findings of blood chemistry: Secondary | ICD-10-CM | POA: Diagnosis not present

## 2022-05-27 DIAGNOSIS — K219 Gastro-esophageal reflux disease without esophagitis: Secondary | ICD-10-CM | POA: Diagnosis not present

## 2022-05-27 DIAGNOSIS — K625 Hemorrhage of anus and rectum: Secondary | ICD-10-CM | POA: Diagnosis not present

## 2022-05-27 DIAGNOSIS — K838 Other specified diseases of biliary tract: Secondary | ICD-10-CM | POA: Diagnosis not present

## 2022-05-27 DIAGNOSIS — R1310 Dysphagia, unspecified: Secondary | ICD-10-CM | POA: Diagnosis not present

## 2022-05-27 DIAGNOSIS — I5030 Unspecified diastolic (congestive) heart failure: Secondary | ICD-10-CM | POA: Diagnosis not present

## 2022-05-27 DIAGNOSIS — I5033 Acute on chronic diastolic (congestive) heart failure: Secondary | ICD-10-CM | POA: Diagnosis not present

## 2022-05-27 DIAGNOSIS — Z950 Presence of cardiac pacemaker: Secondary | ICD-10-CM | POA: Diagnosis not present

## 2022-05-27 DIAGNOSIS — D649 Anemia, unspecified: Secondary | ICD-10-CM | POA: Diagnosis not present

## 2022-05-27 DIAGNOSIS — K449 Diaphragmatic hernia without obstruction or gangrene: Secondary | ICD-10-CM | POA: Diagnosis not present

## 2022-05-27 DIAGNOSIS — I071 Rheumatic tricuspid insufficiency: Secondary | ICD-10-CM | POA: Diagnosis not present

## 2022-05-27 DIAGNOSIS — J9811 Atelectasis: Secondary | ICD-10-CM | POA: Diagnosis not present

## 2022-05-27 DIAGNOSIS — I110 Hypertensive heart disease with heart failure: Secondary | ICD-10-CM | POA: Diagnosis not present

## 2022-05-27 DIAGNOSIS — E11649 Type 2 diabetes mellitus with hypoglycemia without coma: Secondary | ICD-10-CM | POA: Diagnosis not present

## 2022-05-27 DIAGNOSIS — I5031 Acute diastolic (congestive) heart failure: Secondary | ICD-10-CM | POA: Diagnosis not present

## 2022-05-27 DIAGNOSIS — Z66 Do not resuscitate: Secondary | ICD-10-CM | POA: Diagnosis not present

## 2022-05-27 DIAGNOSIS — D500 Iron deficiency anemia secondary to blood loss (chronic): Secondary | ICD-10-CM | POA: Diagnosis not present

## 2022-05-27 DIAGNOSIS — Z95810 Presence of automatic (implantable) cardiac defibrillator: Secondary | ICD-10-CM | POA: Diagnosis not present

## 2022-05-27 DIAGNOSIS — J90 Pleural effusion, not elsewhere classified: Secondary | ICD-10-CM | POA: Diagnosis not present

## 2022-05-27 DIAGNOSIS — I2489 Other forms of acute ischemic heart disease: Secondary | ICD-10-CM | POA: Diagnosis not present

## 2022-05-27 DIAGNOSIS — J9602 Acute respiratory failure with hypercapnia: Secondary | ICD-10-CM | POA: Diagnosis not present

## 2022-05-27 DIAGNOSIS — J9601 Acute respiratory failure with hypoxia: Secondary | ICD-10-CM | POA: Diagnosis not present

## 2022-05-27 DIAGNOSIS — N179 Acute kidney failure, unspecified: Secondary | ICD-10-CM | POA: Diagnosis not present

## 2022-05-27 DIAGNOSIS — J449 Chronic obstructive pulmonary disease, unspecified: Secondary | ICD-10-CM | POA: Diagnosis not present

## 2022-05-27 DIAGNOSIS — Z9981 Dependence on supplemental oxygen: Secondary | ICD-10-CM | POA: Diagnosis not present

## 2022-05-27 DIAGNOSIS — E559 Vitamin D deficiency, unspecified: Secondary | ICD-10-CM | POA: Diagnosis not present

## 2022-05-27 DIAGNOSIS — D519 Vitamin B12 deficiency anemia, unspecified: Secondary | ICD-10-CM | POA: Diagnosis not present

## 2022-05-27 DIAGNOSIS — I480 Paroxysmal atrial fibrillation: Secondary | ICD-10-CM | POA: Diagnosis not present

## 2022-05-27 DIAGNOSIS — R0600 Dyspnea, unspecified: Secondary | ICD-10-CM | POA: Diagnosis not present

## 2022-05-27 DIAGNOSIS — K222 Esophageal obstruction: Secondary | ICD-10-CM | POA: Diagnosis not present

## 2022-05-27 DIAGNOSIS — R0602 Shortness of breath: Secondary | ICD-10-CM | POA: Diagnosis not present

## 2022-05-27 DIAGNOSIS — I358 Other nonrheumatic aortic valve disorders: Secondary | ICD-10-CM | POA: Diagnosis not present

## 2022-06-05 DIAGNOSIS — D500 Iron deficiency anemia secondary to blood loss (chronic): Secondary | ICD-10-CM | POA: Diagnosis not present

## 2022-06-05 DIAGNOSIS — N184 Chronic kidney disease, stage 4 (severe): Secondary | ICD-10-CM | POA: Diagnosis not present

## 2022-06-05 DIAGNOSIS — D509 Iron deficiency anemia, unspecified: Secondary | ICD-10-CM | POA: Diagnosis not present

## 2022-06-05 DIAGNOSIS — D696 Thrombocytopenia, unspecified: Secondary | ICD-10-CM | POA: Diagnosis not present

## 2022-06-05 DIAGNOSIS — Z993 Dependence on wheelchair: Secondary | ICD-10-CM | POA: Diagnosis not present

## 2022-06-05 DIAGNOSIS — Z933 Colostomy status: Secondary | ICD-10-CM | POA: Diagnosis not present

## 2022-06-05 DIAGNOSIS — M159 Polyosteoarthritis, unspecified: Secondary | ICD-10-CM | POA: Diagnosis not present

## 2022-06-05 DIAGNOSIS — I358 Other nonrheumatic aortic valve disorders: Secondary | ICD-10-CM | POA: Diagnosis not present

## 2022-06-05 DIAGNOSIS — K91872 Postprocedural seroma of a digestive system organ or structure following a digestive system procedure: Secondary | ICD-10-CM | POA: Diagnosis not present

## 2022-06-05 DIAGNOSIS — I480 Paroxysmal atrial fibrillation: Secondary | ICD-10-CM | POA: Diagnosis not present

## 2022-06-05 DIAGNOSIS — I517 Cardiomegaly: Secondary | ICD-10-CM | POA: Diagnosis not present

## 2022-06-05 DIAGNOSIS — E039 Hypothyroidism, unspecified: Secondary | ICD-10-CM | POA: Diagnosis not present

## 2022-06-05 DIAGNOSIS — R5381 Other malaise: Secondary | ICD-10-CM | POA: Diagnosis not present

## 2022-06-05 DIAGNOSIS — J9811 Atelectasis: Secondary | ICD-10-CM | POA: Diagnosis not present

## 2022-06-05 DIAGNOSIS — R710 Precipitous drop in hematocrit: Secondary | ICD-10-CM | POA: Diagnosis not present

## 2022-06-05 DIAGNOSIS — E538 Deficiency of other specified B group vitamins: Secondary | ICD-10-CM | POA: Diagnosis not present

## 2022-06-05 DIAGNOSIS — K838 Other specified diseases of biliary tract: Secondary | ICD-10-CM | POA: Diagnosis not present

## 2022-06-05 DIAGNOSIS — D649 Anemia, unspecified: Secondary | ICD-10-CM | POA: Diagnosis not present

## 2022-06-05 DIAGNOSIS — Z743 Need for continuous supervision: Secondary | ICD-10-CM | POA: Diagnosis not present

## 2022-06-05 DIAGNOSIS — J9690 Respiratory failure, unspecified, unspecified whether with hypoxia or hypercapnia: Secondary | ICD-10-CM | POA: Diagnosis not present

## 2022-06-05 DIAGNOSIS — Z9289 Personal history of other medical treatment: Secondary | ICD-10-CM | POA: Diagnosis not present

## 2022-06-05 DIAGNOSIS — R1310 Dysphagia, unspecified: Secondary | ICD-10-CM | POA: Diagnosis not present

## 2022-06-05 DIAGNOSIS — J9611 Chronic respiratory failure with hypoxia: Secondary | ICD-10-CM | POA: Diagnosis not present

## 2022-06-05 DIAGNOSIS — I10 Essential (primary) hypertension: Secondary | ICD-10-CM | POA: Diagnosis not present

## 2022-06-05 DIAGNOSIS — Z8673 Personal history of transient ischemic attack (TIA), and cerebral infarction without residual deficits: Secondary | ICD-10-CM | POA: Diagnosis not present

## 2022-06-05 DIAGNOSIS — Z9049 Acquired absence of other specified parts of digestive tract: Secondary | ICD-10-CM | POA: Diagnosis not present

## 2022-06-05 DIAGNOSIS — Z8719 Personal history of other diseases of the digestive system: Secondary | ICD-10-CM | POA: Diagnosis not present

## 2022-06-05 DIAGNOSIS — K922 Gastrointestinal hemorrhage, unspecified: Secondary | ICD-10-CM | POA: Diagnosis not present

## 2022-06-05 DIAGNOSIS — E785 Hyperlipidemia, unspecified: Secondary | ICD-10-CM | POA: Diagnosis not present

## 2022-06-05 DIAGNOSIS — Z8679 Personal history of other diseases of the circulatory system: Secondary | ICD-10-CM | POA: Diagnosis not present

## 2022-06-05 DIAGNOSIS — I495 Sick sinus syndrome: Secondary | ICD-10-CM | POA: Diagnosis not present

## 2022-06-05 DIAGNOSIS — R600 Localized edema: Secondary | ICD-10-CM | POA: Diagnosis not present

## 2022-06-05 DIAGNOSIS — E782 Mixed hyperlipidemia: Secondary | ICD-10-CM | POA: Diagnosis not present

## 2022-06-05 DIAGNOSIS — K631 Perforation of intestine (nontraumatic): Secondary | ICD-10-CM | POA: Diagnosis not present

## 2022-06-05 DIAGNOSIS — M8580 Other specified disorders of bone density and structure, unspecified site: Secondary | ICD-10-CM | POA: Diagnosis not present

## 2022-06-05 DIAGNOSIS — N200 Calculus of kidney: Secondary | ICD-10-CM | POA: Diagnosis not present

## 2022-06-05 DIAGNOSIS — J90 Pleural effusion, not elsewhere classified: Secondary | ICD-10-CM | POA: Diagnosis not present

## 2022-06-05 DIAGNOSIS — K449 Diaphragmatic hernia without obstruction or gangrene: Secondary | ICD-10-CM | POA: Diagnosis not present

## 2022-06-05 DIAGNOSIS — Z950 Presence of cardiac pacemaker: Secondary | ICD-10-CM | POA: Diagnosis not present

## 2022-06-05 DIAGNOSIS — E876 Hypokalemia: Secondary | ICD-10-CM | POA: Diagnosis not present

## 2022-06-05 DIAGNOSIS — I110 Hypertensive heart disease with heart failure: Secondary | ICD-10-CM | POA: Diagnosis not present

## 2022-06-05 DIAGNOSIS — I5030 Unspecified diastolic (congestive) heart failure: Secondary | ICD-10-CM | POA: Diagnosis not present

## 2022-06-05 DIAGNOSIS — R799 Abnormal finding of blood chemistry, unspecified: Secondary | ICD-10-CM | POA: Diagnosis not present

## 2022-06-05 DIAGNOSIS — Z9889 Other specified postprocedural states: Secondary | ICD-10-CM | POA: Diagnosis not present

## 2022-06-05 DIAGNOSIS — Z7901 Long term (current) use of anticoagulants: Secondary | ICD-10-CM | POA: Diagnosis not present

## 2022-06-05 DIAGNOSIS — E559 Vitamin D deficiency, unspecified: Secondary | ICD-10-CM | POA: Diagnosis not present

## 2022-06-05 DIAGNOSIS — K4020 Bilateral inguinal hernia, without obstruction or gangrene, not specified as recurrent: Secondary | ICD-10-CM | POA: Diagnosis not present

## 2022-06-05 DIAGNOSIS — R1319 Other dysphagia: Secondary | ICD-10-CM | POA: Diagnosis not present

## 2022-06-05 DIAGNOSIS — K5730 Diverticulosis of large intestine without perforation or abscess without bleeding: Secondary | ICD-10-CM | POA: Diagnosis not present

## 2022-06-05 DIAGNOSIS — R918 Other nonspecific abnormal finding of lung field: Secondary | ICD-10-CM | POA: Diagnosis not present

## 2022-06-05 DIAGNOSIS — Z45010 Encounter for checking and testing of cardiac pacemaker pulse generator [battery]: Secondary | ICD-10-CM | POA: Diagnosis not present

## 2022-06-05 DIAGNOSIS — N179 Acute kidney failure, unspecified: Secondary | ICD-10-CM | POA: Diagnosis not present

## 2022-06-05 DIAGNOSIS — I5031 Acute diastolic (congestive) heart failure: Secondary | ICD-10-CM | POA: Diagnosis not present

## 2022-06-05 DIAGNOSIS — I129 Hypertensive chronic kidney disease with stage 1 through stage 4 chronic kidney disease, or unspecified chronic kidney disease: Secondary | ICD-10-CM | POA: Diagnosis not present

## 2022-06-05 DIAGNOSIS — K219 Gastro-esophageal reflux disease without esophagitis: Secondary | ICD-10-CM | POA: Diagnosis not present

## 2022-06-05 DIAGNOSIS — I459 Conduction disorder, unspecified: Secondary | ICD-10-CM | POA: Diagnosis not present

## 2022-06-05 DIAGNOSIS — J441 Chronic obstructive pulmonary disease with (acute) exacerbation: Secondary | ICD-10-CM | POA: Diagnosis not present

## 2022-06-05 DIAGNOSIS — Z87448 Personal history of other diseases of urinary system: Secondary | ICD-10-CM | POA: Diagnosis not present

## 2022-06-05 DIAGNOSIS — Z8709 Personal history of other diseases of the respiratory system: Secondary | ICD-10-CM | POA: Diagnosis not present

## 2022-06-05 DIAGNOSIS — K222 Esophageal obstruction: Secondary | ICD-10-CM | POA: Diagnosis not present

## 2022-06-05 DIAGNOSIS — D7389 Other diseases of spleen: Secondary | ICD-10-CM | POA: Diagnosis not present

## 2022-06-05 DIAGNOSIS — I5043 Acute on chronic combined systolic (congestive) and diastolic (congestive) heart failure: Secondary | ICD-10-CM | POA: Diagnosis not present

## 2022-06-06 DIAGNOSIS — J441 Chronic obstructive pulmonary disease with (acute) exacerbation: Secondary | ICD-10-CM | POA: Diagnosis not present

## 2022-06-06 DIAGNOSIS — Z933 Colostomy status: Secondary | ICD-10-CM | POA: Diagnosis not present

## 2022-06-06 DIAGNOSIS — K838 Other specified diseases of biliary tract: Secondary | ICD-10-CM | POA: Diagnosis not present

## 2022-06-06 DIAGNOSIS — D7389 Other diseases of spleen: Secondary | ICD-10-CM | POA: Diagnosis not present

## 2022-06-06 DIAGNOSIS — Z9049 Acquired absence of other specified parts of digestive tract: Secondary | ICD-10-CM | POA: Diagnosis not present

## 2022-06-06 DIAGNOSIS — Z8673 Personal history of transient ischemic attack (TIA), and cerebral infarction without residual deficits: Secondary | ICD-10-CM | POA: Diagnosis not present

## 2022-06-06 DIAGNOSIS — I480 Paroxysmal atrial fibrillation: Secondary | ICD-10-CM | POA: Diagnosis not present

## 2022-06-06 DIAGNOSIS — Z9289 Personal history of other medical treatment: Secondary | ICD-10-CM | POA: Diagnosis not present

## 2022-06-06 DIAGNOSIS — Z993 Dependence on wheelchair: Secondary | ICD-10-CM | POA: Diagnosis not present

## 2022-06-06 DIAGNOSIS — I110 Hypertensive heart disease with heart failure: Secondary | ICD-10-CM | POA: Diagnosis not present

## 2022-06-06 DIAGNOSIS — E782 Mixed hyperlipidemia: Secondary | ICD-10-CM | POA: Diagnosis not present

## 2022-06-06 DIAGNOSIS — E538 Deficiency of other specified B group vitamins: Secondary | ICD-10-CM | POA: Diagnosis not present

## 2022-06-06 DIAGNOSIS — K219 Gastro-esophageal reflux disease without esophagitis: Secondary | ICD-10-CM | POA: Diagnosis not present

## 2022-06-06 DIAGNOSIS — N179 Acute kidney failure, unspecified: Secondary | ICD-10-CM | POA: Diagnosis not present

## 2022-06-06 DIAGNOSIS — Z7901 Long term (current) use of anticoagulants: Secondary | ICD-10-CM | POA: Diagnosis not present

## 2022-06-06 DIAGNOSIS — M159 Polyosteoarthritis, unspecified: Secondary | ICD-10-CM | POA: Diagnosis not present

## 2022-06-06 DIAGNOSIS — I358 Other nonrheumatic aortic valve disorders: Secondary | ICD-10-CM | POA: Diagnosis not present

## 2022-06-06 DIAGNOSIS — Z9889 Other specified postprocedural states: Secondary | ICD-10-CM | POA: Diagnosis not present

## 2022-06-06 DIAGNOSIS — R5381 Other malaise: Secondary | ICD-10-CM | POA: Diagnosis not present

## 2022-06-06 DIAGNOSIS — R1319 Other dysphagia: Secondary | ICD-10-CM | POA: Diagnosis not present

## 2022-06-06 DIAGNOSIS — M8580 Other specified disorders of bone density and structure, unspecified site: Secondary | ICD-10-CM | POA: Diagnosis not present

## 2022-06-06 DIAGNOSIS — D649 Anemia, unspecified: Secondary | ICD-10-CM | POA: Diagnosis not present

## 2022-06-06 DIAGNOSIS — E559 Vitamin D deficiency, unspecified: Secondary | ICD-10-CM | POA: Diagnosis not present

## 2022-06-06 DIAGNOSIS — Z950 Presence of cardiac pacemaker: Secondary | ICD-10-CM | POA: Diagnosis not present

## 2022-06-06 DIAGNOSIS — I5031 Acute diastolic (congestive) heart failure: Secondary | ICD-10-CM | POA: Diagnosis not present

## 2022-06-06 DIAGNOSIS — Z8679 Personal history of other diseases of the circulatory system: Secondary | ICD-10-CM | POA: Diagnosis not present

## 2022-06-12 DIAGNOSIS — Z87448 Personal history of other diseases of urinary system: Secondary | ICD-10-CM | POA: Diagnosis not present

## 2022-06-12 DIAGNOSIS — I495 Sick sinus syndrome: Secondary | ICD-10-CM | POA: Diagnosis not present

## 2022-06-12 DIAGNOSIS — Z950 Presence of cardiac pacemaker: Secondary | ICD-10-CM | POA: Diagnosis not present

## 2022-06-12 DIAGNOSIS — I480 Paroxysmal atrial fibrillation: Secondary | ICD-10-CM | POA: Diagnosis not present

## 2022-06-12 DIAGNOSIS — I459 Conduction disorder, unspecified: Secondary | ICD-10-CM | POA: Diagnosis not present

## 2022-06-19 DIAGNOSIS — Z950 Presence of cardiac pacemaker: Secondary | ICD-10-CM | POA: Diagnosis not present

## 2022-06-19 DIAGNOSIS — Z933 Colostomy status: Secondary | ICD-10-CM | POA: Diagnosis not present

## 2022-06-19 DIAGNOSIS — Z7901 Long term (current) use of anticoagulants: Secondary | ICD-10-CM | POA: Diagnosis not present

## 2022-06-19 DIAGNOSIS — D509 Iron deficiency anemia, unspecified: Secondary | ICD-10-CM | POA: Diagnosis not present

## 2022-06-19 DIAGNOSIS — R748 Abnormal levels of other serum enzymes: Secondary | ICD-10-CM | POA: Diagnosis not present

## 2022-06-19 DIAGNOSIS — D7389 Other diseases of spleen: Secondary | ICD-10-CM | POA: Diagnosis not present

## 2022-06-19 DIAGNOSIS — Z9981 Dependence on supplemental oxygen: Secondary | ICD-10-CM | POA: Diagnosis not present

## 2022-06-19 DIAGNOSIS — R0989 Other specified symptoms and signs involving the circulatory and respiratory systems: Secondary | ICD-10-CM | POA: Diagnosis not present

## 2022-06-19 DIAGNOSIS — J90 Pleural effusion, not elsewhere classified: Secondary | ICD-10-CM | POA: Diagnosis not present

## 2022-06-19 DIAGNOSIS — K209 Esophagitis, unspecified without bleeding: Secondary | ICD-10-CM | POA: Diagnosis not present

## 2022-06-19 DIAGNOSIS — R799 Abnormal finding of blood chemistry, unspecified: Secondary | ICD-10-CM | POA: Diagnosis not present

## 2022-06-19 DIAGNOSIS — I517 Cardiomegaly: Secondary | ICD-10-CM | POA: Diagnosis not present

## 2022-06-19 DIAGNOSIS — Z8719 Personal history of other diseases of the digestive system: Secondary | ICD-10-CM | POA: Diagnosis not present

## 2022-06-19 DIAGNOSIS — R278 Other lack of coordination: Secondary | ICD-10-CM | POA: Diagnosis not present

## 2022-06-19 DIAGNOSIS — I10 Essential (primary) hypertension: Secondary | ICD-10-CM | POA: Diagnosis not present

## 2022-06-19 DIAGNOSIS — R059 Cough, unspecified: Secondary | ICD-10-CM | POA: Diagnosis not present

## 2022-06-19 DIAGNOSIS — I129 Hypertensive chronic kidney disease with stage 1 through stage 4 chronic kidney disease, or unspecified chronic kidney disease: Secondary | ICD-10-CM | POA: Diagnosis not present

## 2022-06-19 DIAGNOSIS — E039 Hypothyroidism, unspecified: Secondary | ICD-10-CM | POA: Diagnosis not present

## 2022-06-19 DIAGNOSIS — I480 Paroxysmal atrial fibrillation: Secondary | ICD-10-CM | POA: Diagnosis not present

## 2022-06-19 DIAGNOSIS — K449 Diaphragmatic hernia without obstruction or gangrene: Secondary | ICD-10-CM | POA: Diagnosis not present

## 2022-06-19 DIAGNOSIS — D696 Thrombocytopenia, unspecified: Secondary | ICD-10-CM | POA: Diagnosis not present

## 2022-06-19 DIAGNOSIS — K631 Perforation of intestine (nontraumatic): Secondary | ICD-10-CM | POA: Diagnosis not present

## 2022-06-19 DIAGNOSIS — J449 Chronic obstructive pulmonary disease, unspecified: Secondary | ICD-10-CM | POA: Diagnosis not present

## 2022-06-19 DIAGNOSIS — Z7409 Other reduced mobility: Secondary | ICD-10-CM | POA: Diagnosis not present

## 2022-06-19 DIAGNOSIS — J9811 Atelectasis: Secondary | ICD-10-CM | POA: Diagnosis not present

## 2022-06-19 DIAGNOSIS — N189 Chronic kidney disease, unspecified: Secondary | ICD-10-CM | POA: Diagnosis not present

## 2022-06-19 DIAGNOSIS — J9611 Chronic respiratory failure with hypoxia: Secondary | ICD-10-CM | POA: Diagnosis not present

## 2022-06-19 DIAGNOSIS — K222 Esophageal obstruction: Secondary | ICD-10-CM | POA: Diagnosis not present

## 2022-06-19 DIAGNOSIS — K219 Gastro-esophageal reflux disease without esophagitis: Secondary | ICD-10-CM | POA: Diagnosis not present

## 2022-06-19 DIAGNOSIS — R600 Localized edema: Secondary | ICD-10-CM | POA: Diagnosis not present

## 2022-06-19 DIAGNOSIS — K91872 Postprocedural seroma of a digestive system organ or structure following a digestive system procedure: Secondary | ICD-10-CM | POA: Diagnosis not present

## 2022-06-19 DIAGNOSIS — M6281 Muscle weakness (generalized): Secondary | ICD-10-CM | POA: Diagnosis not present

## 2022-06-19 DIAGNOSIS — E785 Hyperlipidemia, unspecified: Secondary | ICD-10-CM | POA: Diagnosis not present

## 2022-06-19 DIAGNOSIS — K922 Gastrointestinal hemorrhage, unspecified: Secondary | ICD-10-CM | POA: Diagnosis not present

## 2022-06-19 DIAGNOSIS — R710 Precipitous drop in hematocrit: Secondary | ICD-10-CM | POA: Diagnosis not present

## 2022-06-19 DIAGNOSIS — Z8709 Personal history of other diseases of the respiratory system: Secondary | ICD-10-CM | POA: Diagnosis not present

## 2022-06-19 DIAGNOSIS — K5730 Diverticulosis of large intestine without perforation or abscess without bleeding: Secondary | ICD-10-CM | POA: Diagnosis not present

## 2022-06-19 DIAGNOSIS — N200 Calculus of kidney: Secondary | ICD-10-CM | POA: Diagnosis not present

## 2022-06-19 DIAGNOSIS — R1310 Dysphagia, unspecified: Secondary | ICD-10-CM | POA: Diagnosis not present

## 2022-06-19 DIAGNOSIS — Z743 Need for continuous supervision: Secondary | ICD-10-CM | POA: Diagnosis not present

## 2022-06-19 DIAGNOSIS — N184 Chronic kidney disease, stage 4 (severe): Secondary | ICD-10-CM | POA: Diagnosis not present

## 2022-06-19 DIAGNOSIS — K4020 Bilateral inguinal hernia, without obstruction or gangrene, not specified as recurrent: Secondary | ICD-10-CM | POA: Diagnosis not present

## 2022-06-19 DIAGNOSIS — R918 Other nonspecific abnormal finding of lung field: Secondary | ICD-10-CM | POA: Diagnosis not present

## 2022-06-19 DIAGNOSIS — D649 Anemia, unspecified: Secondary | ICD-10-CM | POA: Diagnosis not present

## 2022-06-19 DIAGNOSIS — K2289 Other specified disease of esophagus: Secondary | ICD-10-CM | POA: Diagnosis not present

## 2022-06-19 DIAGNOSIS — R262 Difficulty in walking, not elsewhere classified: Secondary | ICD-10-CM | POA: Diagnosis not present

## 2022-06-19 DIAGNOSIS — E876 Hypokalemia: Secondary | ICD-10-CM | POA: Diagnosis not present

## 2022-06-21 DIAGNOSIS — Z8709 Personal history of other diseases of the respiratory system: Secondary | ICD-10-CM | POA: Diagnosis not present

## 2022-06-21 DIAGNOSIS — K4020 Bilateral inguinal hernia, without obstruction or gangrene, not specified as recurrent: Secondary | ICD-10-CM | POA: Diagnosis not present

## 2022-06-21 DIAGNOSIS — J90 Pleural effusion, not elsewhere classified: Secondary | ICD-10-CM | POA: Diagnosis not present

## 2022-06-21 DIAGNOSIS — J9611 Chronic respiratory failure with hypoxia: Secondary | ICD-10-CM | POA: Diagnosis not present

## 2022-06-21 DIAGNOSIS — N200 Calculus of kidney: Secondary | ICD-10-CM | POA: Diagnosis not present

## 2022-06-21 DIAGNOSIS — N184 Chronic kidney disease, stage 4 (severe): Secondary | ICD-10-CM | POA: Diagnosis not present

## 2022-06-21 DIAGNOSIS — I10 Essential (primary) hypertension: Secondary | ICD-10-CM | POA: Diagnosis not present

## 2022-06-21 DIAGNOSIS — R918 Other nonspecific abnormal finding of lung field: Secondary | ICD-10-CM | POA: Diagnosis not present

## 2022-06-21 DIAGNOSIS — K91872 Postprocedural seroma of a digestive system organ or structure following a digestive system procedure: Secondary | ICD-10-CM | POA: Diagnosis not present

## 2022-06-21 DIAGNOSIS — K5730 Diverticulosis of large intestine without perforation or abscess without bleeding: Secondary | ICD-10-CM | POA: Diagnosis not present

## 2022-06-21 DIAGNOSIS — J9811 Atelectasis: Secondary | ICD-10-CM | POA: Diagnosis not present

## 2022-06-21 DIAGNOSIS — D649 Anemia, unspecified: Secondary | ICD-10-CM | POA: Diagnosis not present

## 2022-06-21 DIAGNOSIS — E876 Hypokalemia: Secondary | ICD-10-CM | POA: Diagnosis not present

## 2022-06-21 DIAGNOSIS — I517 Cardiomegaly: Secondary | ICD-10-CM | POA: Diagnosis not present

## 2022-06-21 DIAGNOSIS — R600 Localized edema: Secondary | ICD-10-CM | POA: Diagnosis not present

## 2022-06-21 DIAGNOSIS — I480 Paroxysmal atrial fibrillation: Secondary | ICD-10-CM | POA: Diagnosis not present

## 2022-06-21 DIAGNOSIS — E785 Hyperlipidemia, unspecified: Secondary | ICD-10-CM | POA: Diagnosis not present

## 2022-06-21 DIAGNOSIS — E039 Hypothyroidism, unspecified: Secondary | ICD-10-CM | POA: Diagnosis not present

## 2022-06-22 DIAGNOSIS — K209 Esophagitis, unspecified without bleeding: Secondary | ICD-10-CM | POA: Diagnosis not present

## 2022-06-22 DIAGNOSIS — I10 Essential (primary) hypertension: Secondary | ICD-10-CM | POA: Diagnosis not present

## 2022-06-22 DIAGNOSIS — D649 Anemia, unspecified: Secondary | ICD-10-CM | POA: Diagnosis not present

## 2022-06-22 DIAGNOSIS — K922 Gastrointestinal hemorrhage, unspecified: Secondary | ICD-10-CM | POA: Diagnosis not present

## 2022-06-22 DIAGNOSIS — K5730 Diverticulosis of large intestine without perforation or abscess without bleeding: Secondary | ICD-10-CM | POA: Diagnosis not present

## 2022-06-23 DIAGNOSIS — D649 Anemia, unspecified: Secondary | ICD-10-CM | POA: Diagnosis not present

## 2022-06-24 DIAGNOSIS — D649 Anemia, unspecified: Secondary | ICD-10-CM | POA: Diagnosis not present

## 2022-06-25 DIAGNOSIS — J90 Pleural effusion, not elsewhere classified: Secondary | ICD-10-CM | POA: Diagnosis not present

## 2022-06-25 DIAGNOSIS — R918 Other nonspecific abnormal finding of lung field: Secondary | ICD-10-CM | POA: Diagnosis not present

## 2022-06-25 DIAGNOSIS — R0989 Other specified symptoms and signs involving the circulatory and respiratory systems: Secondary | ICD-10-CM | POA: Diagnosis not present

## 2022-06-25 DIAGNOSIS — D649 Anemia, unspecified: Secondary | ICD-10-CM | POA: Diagnosis not present

## 2022-06-26 DIAGNOSIS — D649 Anemia, unspecified: Secondary | ICD-10-CM | POA: Diagnosis not present

## 2022-06-27 DIAGNOSIS — D649 Anemia, unspecified: Secondary | ICD-10-CM | POA: Diagnosis not present

## 2022-06-28 DIAGNOSIS — D649 Anemia, unspecified: Secondary | ICD-10-CM | POA: Diagnosis not present

## 2022-06-29 DIAGNOSIS — D649 Anemia, unspecified: Secondary | ICD-10-CM | POA: Diagnosis not present

## 2022-06-30 DIAGNOSIS — D649 Anemia, unspecified: Secondary | ICD-10-CM | POA: Diagnosis not present

## 2022-07-01 DIAGNOSIS — D649 Anemia, unspecified: Secondary | ICD-10-CM | POA: Diagnosis not present

## 2022-07-02 DIAGNOSIS — J80 Acute respiratory distress syndrome: Secondary | ICD-10-CM | POA: Diagnosis not present

## 2022-07-02 DIAGNOSIS — I081 Rheumatic disorders of both mitral and tricuspid valves: Secondary | ICD-10-CM | POA: Diagnosis not present

## 2022-07-02 DIAGNOSIS — N179 Acute kidney failure, unspecified: Secondary | ICD-10-CM | POA: Diagnosis not present

## 2022-07-02 DIAGNOSIS — Z79899 Other long term (current) drug therapy: Secondary | ICD-10-CM | POA: Diagnosis not present

## 2022-07-02 DIAGNOSIS — J811 Chronic pulmonary edema: Secondary | ICD-10-CM | POA: Diagnosis not present

## 2022-07-02 DIAGNOSIS — Z9981 Dependence on supplemental oxygen: Secondary | ICD-10-CM | POA: Diagnosis not present

## 2022-07-02 DIAGNOSIS — D631 Anemia in chronic kidney disease: Secondary | ICD-10-CM | POA: Diagnosis not present

## 2022-07-02 DIAGNOSIS — I517 Cardiomegaly: Secondary | ICD-10-CM | POA: Diagnosis not present

## 2022-07-02 DIAGNOSIS — Z7901 Long term (current) use of anticoagulants: Secondary | ICD-10-CM | POA: Diagnosis not present

## 2022-07-02 DIAGNOSIS — R059 Cough, unspecified: Secondary | ICD-10-CM | POA: Diagnosis not present

## 2022-07-02 DIAGNOSIS — Z8673 Personal history of transient ischemic attack (TIA), and cerebral infarction without residual deficits: Secondary | ICD-10-CM | POA: Diagnosis not present

## 2022-07-02 DIAGNOSIS — Z743 Need for continuous supervision: Secondary | ICD-10-CM | POA: Diagnosis not present

## 2022-07-02 DIAGNOSIS — J9601 Acute respiratory failure with hypoxia: Secondary | ICD-10-CM | POA: Diagnosis not present

## 2022-07-02 DIAGNOSIS — E785 Hyperlipidemia, unspecified: Secondary | ICD-10-CM | POA: Diagnosis not present

## 2022-07-02 DIAGNOSIS — I480 Paroxysmal atrial fibrillation: Secondary | ICD-10-CM | POA: Diagnosis not present

## 2022-07-02 DIAGNOSIS — R846 Abnormal cytological findings in specimens from respiratory organs and thorax: Secondary | ICD-10-CM | POA: Diagnosis not present

## 2022-07-02 DIAGNOSIS — J918 Pleural effusion in other conditions classified elsewhere: Secondary | ICD-10-CM | POA: Diagnosis not present

## 2022-07-02 DIAGNOSIS — I10 Essential (primary) hypertension: Secondary | ICD-10-CM | POA: Diagnosis not present

## 2022-07-02 DIAGNOSIS — D689 Coagulation defect, unspecified: Secondary | ICD-10-CM | POA: Diagnosis not present

## 2022-07-02 DIAGNOSIS — E876 Hypokalemia: Secondary | ICD-10-CM | POA: Diagnosis not present

## 2022-07-02 DIAGNOSIS — J810 Acute pulmonary edema: Secondary | ICD-10-CM | POA: Diagnosis not present

## 2022-07-02 DIAGNOSIS — R748 Abnormal levels of other serum enzymes: Secondary | ICD-10-CM | POA: Diagnosis not present

## 2022-07-02 DIAGNOSIS — Z48813 Encounter for surgical aftercare following surgery on the respiratory system: Secondary | ICD-10-CM | POA: Diagnosis not present

## 2022-07-02 DIAGNOSIS — J90 Pleural effusion, not elsewhere classified: Secondary | ICD-10-CM | POA: Diagnosis not present

## 2022-07-02 DIAGNOSIS — Z7951 Long term (current) use of inhaled steroids: Secondary | ICD-10-CM | POA: Diagnosis not present

## 2022-07-02 DIAGNOSIS — Z950 Presence of cardiac pacemaker: Secondary | ICD-10-CM | POA: Diagnosis not present

## 2022-07-02 DIAGNOSIS — J441 Chronic obstructive pulmonary disease with (acute) exacerbation: Secondary | ICD-10-CM | POA: Diagnosis not present

## 2022-07-02 DIAGNOSIS — I495 Sick sinus syndrome: Secondary | ICD-10-CM | POA: Diagnosis not present

## 2022-07-02 DIAGNOSIS — E782 Mixed hyperlipidemia: Secondary | ICD-10-CM | POA: Diagnosis not present

## 2022-07-02 DIAGNOSIS — N184 Chronic kidney disease, stage 4 (severe): Secondary | ICD-10-CM | POA: Diagnosis not present

## 2022-07-02 DIAGNOSIS — I509 Heart failure, unspecified: Secondary | ICD-10-CM | POA: Diagnosis not present

## 2022-07-02 DIAGNOSIS — Z933 Colostomy status: Secondary | ICD-10-CM | POA: Diagnosis not present

## 2022-07-02 DIAGNOSIS — J9611 Chronic respiratory failure with hypoxia: Secondary | ICD-10-CM | POA: Diagnosis not present

## 2022-07-02 DIAGNOSIS — R262 Difficulty in walking, not elsewhere classified: Secondary | ICD-10-CM | POA: Diagnosis not present

## 2022-07-02 DIAGNOSIS — I130 Hypertensive heart and chronic kidney disease with heart failure and stage 1 through stage 4 chronic kidney disease, or unspecified chronic kidney disease: Secondary | ICD-10-CM | POA: Diagnosis not present

## 2022-07-02 DIAGNOSIS — I482 Chronic atrial fibrillation, unspecified: Secondary | ICD-10-CM | POA: Diagnosis not present

## 2022-07-02 DIAGNOSIS — Z87891 Personal history of nicotine dependence: Secondary | ICD-10-CM | POA: Diagnosis not present

## 2022-07-02 DIAGNOSIS — I358 Other nonrheumatic aortic valve disorders: Secondary | ICD-10-CM | POA: Diagnosis not present

## 2022-07-02 DIAGNOSIS — K219 Gastro-esophageal reflux disease without esophagitis: Secondary | ICD-10-CM | POA: Diagnosis not present

## 2022-07-02 DIAGNOSIS — Z45018 Encounter for adjustment and management of other part of cardiac pacemaker: Secondary | ICD-10-CM | POA: Diagnosis not present

## 2022-07-02 DIAGNOSIS — I4891 Unspecified atrial fibrillation: Secondary | ICD-10-CM | POA: Diagnosis not present

## 2022-07-02 DIAGNOSIS — R278 Other lack of coordination: Secondary | ICD-10-CM | POA: Diagnosis not present

## 2022-07-02 DIAGNOSIS — R1310 Dysphagia, unspecified: Secondary | ICD-10-CM | POA: Diagnosis not present

## 2022-07-02 DIAGNOSIS — R0989 Other specified symptoms and signs involving the circulatory and respiratory systems: Secondary | ICD-10-CM | POA: Diagnosis not present

## 2022-07-02 DIAGNOSIS — R918 Other nonspecific abnormal finding of lung field: Secondary | ICD-10-CM | POA: Diagnosis not present

## 2022-07-02 DIAGNOSIS — I272 Pulmonary hypertension, unspecified: Secondary | ICD-10-CM | POA: Diagnosis not present

## 2022-07-02 DIAGNOSIS — K222 Esophageal obstruction: Secondary | ICD-10-CM | POA: Diagnosis not present

## 2022-07-02 DIAGNOSIS — D649 Anemia, unspecified: Secondary | ICD-10-CM | POA: Diagnosis not present

## 2022-07-02 DIAGNOSIS — Z8679 Personal history of other diseases of the circulatory system: Secondary | ICD-10-CM | POA: Diagnosis not present

## 2022-07-02 DIAGNOSIS — E039 Hypothyroidism, unspecified: Secondary | ICD-10-CM | POA: Diagnosis not present

## 2022-07-02 DIAGNOSIS — J9621 Acute and chronic respiratory failure with hypoxia: Secondary | ICD-10-CM | POA: Diagnosis not present

## 2022-07-02 DIAGNOSIS — Z7409 Other reduced mobility: Secondary | ICD-10-CM | POA: Diagnosis not present

## 2022-07-02 DIAGNOSIS — M6281 Muscle weakness (generalized): Secondary | ICD-10-CM | POA: Diagnosis not present

## 2022-07-03 DIAGNOSIS — E782 Mixed hyperlipidemia: Secondary | ICD-10-CM | POA: Diagnosis not present

## 2022-07-03 DIAGNOSIS — Z8679 Personal history of other diseases of the circulatory system: Secondary | ICD-10-CM | POA: Diagnosis not present

## 2022-07-03 DIAGNOSIS — E039 Hypothyroidism, unspecified: Secondary | ICD-10-CM | POA: Diagnosis not present

## 2022-07-03 DIAGNOSIS — J441 Chronic obstructive pulmonary disease with (acute) exacerbation: Secondary | ICD-10-CM | POA: Diagnosis not present

## 2022-07-03 DIAGNOSIS — K219 Gastro-esophageal reflux disease without esophagitis: Secondary | ICD-10-CM | POA: Diagnosis not present

## 2022-07-03 DIAGNOSIS — I130 Hypertensive heart and chronic kidney disease with heart failure and stage 1 through stage 4 chronic kidney disease, or unspecified chronic kidney disease: Secondary | ICD-10-CM | POA: Diagnosis not present

## 2022-07-03 DIAGNOSIS — J9611 Chronic respiratory failure with hypoxia: Secondary | ICD-10-CM | POA: Diagnosis not present

## 2022-07-03 DIAGNOSIS — R748 Abnormal levels of other serum enzymes: Secondary | ICD-10-CM | POA: Diagnosis not present

## 2022-07-03 DIAGNOSIS — Z950 Presence of cardiac pacemaker: Secondary | ICD-10-CM | POA: Diagnosis not present

## 2022-07-03 DIAGNOSIS — I509 Heart failure, unspecified: Secondary | ICD-10-CM | POA: Diagnosis not present

## 2022-07-03 DIAGNOSIS — K222 Esophageal obstruction: Secondary | ICD-10-CM | POA: Diagnosis not present

## 2022-07-03 DIAGNOSIS — Z8673 Personal history of transient ischemic attack (TIA), and cerebral infarction without residual deficits: Secondary | ICD-10-CM | POA: Diagnosis not present

## 2022-07-03 DIAGNOSIS — Z933 Colostomy status: Secondary | ICD-10-CM | POA: Diagnosis not present

## 2022-07-03 DIAGNOSIS — Z7901 Long term (current) use of anticoagulants: Secondary | ICD-10-CM | POA: Diagnosis not present

## 2022-07-03 DIAGNOSIS — I358 Other nonrheumatic aortic valve disorders: Secondary | ICD-10-CM | POA: Diagnosis not present

## 2022-07-03 DIAGNOSIS — N184 Chronic kidney disease, stage 4 (severe): Secondary | ICD-10-CM | POA: Diagnosis not present

## 2022-07-03 DIAGNOSIS — D631 Anemia in chronic kidney disease: Secondary | ICD-10-CM | POA: Diagnosis not present

## 2022-07-03 DIAGNOSIS — I480 Paroxysmal atrial fibrillation: Secondary | ICD-10-CM | POA: Diagnosis not present

## 2022-07-10 DIAGNOSIS — K222 Esophageal obstruction: Secondary | ICD-10-CM | POA: Diagnosis not present

## 2022-07-15 DIAGNOSIS — I4891 Unspecified atrial fibrillation: Secondary | ICD-10-CM | POA: Diagnosis not present

## 2022-07-15 DIAGNOSIS — I10 Essential (primary) hypertension: Secondary | ICD-10-CM | POA: Diagnosis not present

## 2022-07-25 ENCOUNTER — Ambulatory Visit: Payer: Medicare Other | Admitting: Podiatry

## 2022-07-25 DIAGNOSIS — I10 Essential (primary) hypertension: Secondary | ICD-10-CM | POA: Diagnosis not present

## 2022-07-25 DIAGNOSIS — I495 Sick sinus syndrome: Secondary | ICD-10-CM | POA: Diagnosis not present

## 2022-07-25 DIAGNOSIS — Z45018 Encounter for adjustment and management of other part of cardiac pacemaker: Secondary | ICD-10-CM | POA: Diagnosis not present

## 2022-07-25 DIAGNOSIS — Z7901 Long term (current) use of anticoagulants: Secondary | ICD-10-CM | POA: Diagnosis not present

## 2022-07-25 DIAGNOSIS — D649 Anemia, unspecified: Secondary | ICD-10-CM | POA: Diagnosis not present

## 2022-07-25 DIAGNOSIS — I480 Paroxysmal atrial fibrillation: Secondary | ICD-10-CM | POA: Diagnosis not present

## 2022-07-25 DIAGNOSIS — Z950 Presence of cardiac pacemaker: Secondary | ICD-10-CM | POA: Diagnosis not present

## 2022-07-27 DIAGNOSIS — Z7901 Long term (current) use of anticoagulants: Secondary | ICD-10-CM | POA: Diagnosis not present

## 2022-07-27 DIAGNOSIS — Z933 Colostomy status: Secondary | ICD-10-CM | POA: Diagnosis not present

## 2022-07-27 DIAGNOSIS — I272 Pulmonary hypertension, unspecified: Secondary | ICD-10-CM | POA: Diagnosis not present

## 2022-07-27 DIAGNOSIS — J441 Chronic obstructive pulmonary disease with (acute) exacerbation: Secondary | ICD-10-CM | POA: Diagnosis not present

## 2022-07-27 DIAGNOSIS — Z8673 Personal history of transient ischemic attack (TIA), and cerebral infarction without residual deficits: Secondary | ICD-10-CM | POA: Diagnosis not present

## 2022-07-27 DIAGNOSIS — I509 Heart failure, unspecified: Secondary | ICD-10-CM | POA: Diagnosis not present

## 2022-07-27 DIAGNOSIS — I480 Paroxysmal atrial fibrillation: Secondary | ICD-10-CM | POA: Diagnosis not present

## 2022-07-27 DIAGNOSIS — I358 Other nonrheumatic aortic valve disorders: Secondary | ICD-10-CM | POA: Diagnosis not present

## 2022-07-27 DIAGNOSIS — K219 Gastro-esophageal reflux disease without esophagitis: Secondary | ICD-10-CM | POA: Diagnosis not present

## 2022-07-27 DIAGNOSIS — E782 Mixed hyperlipidemia: Secondary | ICD-10-CM | POA: Diagnosis not present

## 2022-07-30 DIAGNOSIS — J90 Pleural effusion, not elsewhere classified: Secondary | ICD-10-CM | POA: Diagnosis not present

## 2022-07-30 DIAGNOSIS — J810 Acute pulmonary edema: Secondary | ICD-10-CM | POA: Diagnosis not present

## 2022-07-30 DIAGNOSIS — R0989 Other specified symptoms and signs involving the circulatory and respiratory systems: Secondary | ICD-10-CM | POA: Diagnosis not present

## 2022-07-30 DIAGNOSIS — E876 Hypokalemia: Secondary | ICD-10-CM | POA: Diagnosis not present

## 2022-07-30 DIAGNOSIS — I081 Rheumatic disorders of both mitral and tricuspid valves: Secondary | ICD-10-CM | POA: Diagnosis not present

## 2022-07-30 DIAGNOSIS — I517 Cardiomegaly: Secondary | ICD-10-CM | POA: Diagnosis not present

## 2022-07-30 DIAGNOSIS — R609 Edema, unspecified: Secondary | ICD-10-CM | POA: Diagnosis not present

## 2022-07-30 DIAGNOSIS — J9601 Acute respiratory failure with hypoxia: Secondary | ICD-10-CM | POA: Diagnosis not present

## 2022-07-30 DIAGNOSIS — R846 Abnormal cytological findings in specimens from respiratory organs and thorax: Secondary | ICD-10-CM | POA: Diagnosis not present

## 2022-07-30 DIAGNOSIS — Z7901 Long term (current) use of anticoagulants: Secondary | ICD-10-CM | POA: Diagnosis not present

## 2022-07-30 DIAGNOSIS — Z9981 Dependence on supplemental oxygen: Secondary | ICD-10-CM | POA: Diagnosis not present

## 2022-07-30 DIAGNOSIS — Z87891 Personal history of nicotine dependence: Secondary | ICD-10-CM | POA: Diagnosis not present

## 2022-07-30 DIAGNOSIS — Z7401 Bed confinement status: Secondary | ICD-10-CM | POA: Diagnosis not present

## 2022-07-30 DIAGNOSIS — I495 Sick sinus syndrome: Secondary | ICD-10-CM | POA: Diagnosis not present

## 2022-07-30 DIAGNOSIS — I50813 Acute on chronic right heart failure: Secondary | ICD-10-CM | POA: Diagnosis not present

## 2022-07-30 DIAGNOSIS — Z79899 Other long term (current) drug therapy: Secondary | ICD-10-CM | POA: Diagnosis not present

## 2022-07-30 DIAGNOSIS — Z950 Presence of cardiac pacemaker: Secondary | ICD-10-CM | POA: Diagnosis not present

## 2022-07-30 DIAGNOSIS — J431 Panlobular emphysema: Secondary | ICD-10-CM | POA: Diagnosis not present

## 2022-07-30 DIAGNOSIS — D631 Anemia in chronic kidney disease: Secondary | ICD-10-CM | POA: Diagnosis not present

## 2022-07-30 DIAGNOSIS — I4891 Unspecified atrial fibrillation: Secondary | ICD-10-CM | POA: Diagnosis not present

## 2022-07-30 DIAGNOSIS — I130 Hypertensive heart and chronic kidney disease with heart failure and stage 1 through stage 4 chronic kidney disease, or unspecified chronic kidney disease: Secondary | ICD-10-CM | POA: Diagnosis not present

## 2022-07-30 DIAGNOSIS — E785 Hyperlipidemia, unspecified: Secondary | ICD-10-CM | POA: Diagnosis not present

## 2022-07-30 DIAGNOSIS — J811 Chronic pulmonary edema: Secondary | ICD-10-CM | POA: Diagnosis not present

## 2022-07-30 DIAGNOSIS — I5023 Acute on chronic systolic (congestive) heart failure: Secondary | ICD-10-CM | POA: Diagnosis not present

## 2022-07-30 DIAGNOSIS — N179 Acute kidney failure, unspecified: Secondary | ICD-10-CM | POA: Diagnosis not present

## 2022-07-30 DIAGNOSIS — Z933 Colostomy status: Secondary | ICD-10-CM | POA: Diagnosis not present

## 2022-07-30 DIAGNOSIS — J80 Acute respiratory distress syndrome: Secondary | ICD-10-CM | POA: Diagnosis not present

## 2022-07-30 DIAGNOSIS — E039 Hypothyroidism, unspecified: Secondary | ICD-10-CM | POA: Diagnosis not present

## 2022-07-30 DIAGNOSIS — I10 Essential (primary) hypertension: Secondary | ICD-10-CM | POA: Diagnosis not present

## 2022-07-30 DIAGNOSIS — R0689 Other abnormalities of breathing: Secondary | ICD-10-CM | POA: Diagnosis not present

## 2022-07-30 DIAGNOSIS — Z7409 Other reduced mobility: Secondary | ICD-10-CM | POA: Diagnosis not present

## 2022-07-30 DIAGNOSIS — J918 Pleural effusion in other conditions classified elsewhere: Secondary | ICD-10-CM | POA: Diagnosis not present

## 2022-07-30 DIAGNOSIS — Z48813 Encounter for surgical aftercare following surgery on the respiratory system: Secondary | ICD-10-CM | POA: Diagnosis not present

## 2022-07-30 DIAGNOSIS — Z7951 Long term (current) use of inhaled steroids: Secondary | ICD-10-CM | POA: Diagnosis not present

## 2022-07-30 DIAGNOSIS — J9621 Acute and chronic respiratory failure with hypoxia: Secondary | ICD-10-CM | POA: Diagnosis not present

## 2022-07-30 DIAGNOSIS — N184 Chronic kidney disease, stage 4 (severe): Secondary | ICD-10-CM | POA: Diagnosis not present

## 2022-07-30 DIAGNOSIS — Z7189 Other specified counseling: Secondary | ICD-10-CM | POA: Diagnosis not present

## 2022-07-30 DIAGNOSIS — Z515 Encounter for palliative care: Secondary | ICD-10-CM | POA: Diagnosis not present

## 2022-07-30 DIAGNOSIS — I482 Chronic atrial fibrillation, unspecified: Secondary | ICD-10-CM | POA: Diagnosis not present

## 2022-07-30 DIAGNOSIS — J984 Other disorders of lung: Secondary | ICD-10-CM | POA: Diagnosis not present

## 2022-07-30 DIAGNOSIS — Z8673 Personal history of transient ischemic attack (TIA), and cerebral infarction without residual deficits: Secondary | ICD-10-CM | POA: Diagnosis not present

## 2022-07-30 DIAGNOSIS — R918 Other nonspecific abnormal finding of lung field: Secondary | ICD-10-CM | POA: Diagnosis not present

## 2022-07-30 DIAGNOSIS — D689 Coagulation defect, unspecified: Secondary | ICD-10-CM | POA: Diagnosis not present

## 2022-07-30 DIAGNOSIS — I480 Paroxysmal atrial fibrillation: Secondary | ICD-10-CM | POA: Diagnosis not present

## 2022-07-30 DIAGNOSIS — Z743 Need for continuous supervision: Secondary | ICD-10-CM | POA: Diagnosis not present

## 2022-07-30 DIAGNOSIS — I509 Heart failure, unspecified: Secondary | ICD-10-CM | POA: Diagnosis not present

## 2022-08-02 DIAGNOSIS — R846 Abnormal cytological findings in specimens from respiratory organs and thorax: Secondary | ICD-10-CM | POA: Diagnosis not present

## 2022-08-02 DIAGNOSIS — J90 Pleural effusion, not elsewhere classified: Secondary | ICD-10-CM | POA: Diagnosis not present

## 2022-08-09 ENCOUNTER — Telehealth: Payer: Self-pay | Admitting: *Deleted

## 2022-08-09 NOTE — Progress Notes (Signed)
  Care Coordination  Outreach Note  08/09/2022 Name: Kelly Terrell MRN: [id number] DOB: 09-Mar-1929   Care Coordination Outreach Attempts: An unsuccessful telephone outreach was attempted today to offer the patient information about available care coordination services.  Follow Up Plan:  Additional outreach attempts will be made to offer the patient care coordination information and services.   Encounter Outcome:  No Answer  Burman Nieves, CCMA Care Coordination Care Guide Direct Dial: [phone]

## 2022-08-20 DIAGNOSIS — J449 Chronic obstructive pulmonary disease, unspecified: Secondary | ICD-10-CM | POA: Diagnosis not present

## 2022-09-10 DIAGNOSIS — I495 Sick sinus syndrome: Secondary | ICD-10-CM | POA: Diagnosis not present

## 2022-09-10 DIAGNOSIS — Z45010 Encounter for checking and testing of cardiac pacemaker pulse generator [battery]: Secondary | ICD-10-CM | POA: Diagnosis not present

## 2022-11-02 DIAGNOSIS — I495 Sick sinus syndrome: Secondary | ICD-10-CM | POA: Diagnosis not present

## 2022-11-02 DIAGNOSIS — J449 Chronic obstructive pulmonary disease, unspecified: Secondary | ICD-10-CM | POA: Diagnosis not present

## 2022-11-02 DIAGNOSIS — I2510 Atherosclerotic heart disease of native coronary artery without angina pectoris: Secondary | ICD-10-CM | POA: Diagnosis not present

## 2022-12-10 DIAGNOSIS — I495 Sick sinus syndrome: Secondary | ICD-10-CM | POA: Diagnosis not present

## 2022-12-10 DIAGNOSIS — Z45018 Encounter for adjustment and management of other part of cardiac pacemaker: Secondary | ICD-10-CM | POA: Diagnosis not present

## 2022-12-10 DIAGNOSIS — I4819 Other persistent atrial fibrillation: Secondary | ICD-10-CM | POA: Diagnosis not present

## 2023-02-07 DIAGNOSIS — I2510 Atherosclerotic heart disease of native coronary artery without angina pectoris: Secondary | ICD-10-CM | POA: Diagnosis not present

## 2023-02-07 DIAGNOSIS — J449 Chronic obstructive pulmonary disease, unspecified: Secondary | ICD-10-CM | POA: Diagnosis not present

## 2023-02-07 DIAGNOSIS — I495 Sick sinus syndrome: Secondary | ICD-10-CM | POA: Diagnosis not present

## 2023-03-13 DIAGNOSIS — I495 Sick sinus syndrome: Secondary | ICD-10-CM | POA: Diagnosis not present

## 2023-03-13 DIAGNOSIS — Z45018 Encounter for adjustment and management of other part of cardiac pacemaker: Secondary | ICD-10-CM | POA: Diagnosis not present

## 2023-07-03 DIAGNOSIS — Z45010 Encounter for checking and testing of cardiac pacemaker pulse generator [battery]: Secondary | ICD-10-CM | POA: Diagnosis not present

## 2023-07-03 DIAGNOSIS — I495 Sick sinus syndrome: Secondary | ICD-10-CM | POA: Diagnosis not present

## 2023-10-03 DIAGNOSIS — I2510 Atherosclerotic heart disease of native coronary artery without angina pectoris: Secondary | ICD-10-CM | POA: Diagnosis not present

## 2023-10-03 DIAGNOSIS — J449 Chronic obstructive pulmonary disease, unspecified: Secondary | ICD-10-CM | POA: Diagnosis not present
